# Patient Record
Sex: Female | Born: 1965 | Race: Black or African American | Hispanic: No | Marital: Married | Smoking: Never smoker
Health system: Southern US, Community
[De-identification: ages and names within clinical notes are randomized; demographics above are authoritative.]

## PROBLEM LIST (undated history)

## (undated) DIAGNOSIS — E079 Disorder of thyroid, unspecified: Secondary | ICD-10-CM

## (undated) DIAGNOSIS — D509 Iron deficiency anemia, unspecified: Secondary | ICD-10-CM

## (undated) DIAGNOSIS — I10 Essential (primary) hypertension: Secondary | ICD-10-CM

## (undated) DIAGNOSIS — I219 Acute myocardial infarction, unspecified: Secondary | ICD-10-CM

## (undated) HISTORY — DX: Essential (primary) hypertension: I10

## (undated) HISTORY — DX: Acute myocardial infarction, unspecified: I21.9

## (undated) HISTORY — DX: Iron deficiency anemia, unspecified: D50.9

---

## 2008-01-08 ENCOUNTER — Ambulatory Visit: Payer: Self-pay | Admitting: Internal Medicine

## 2008-04-28 ENCOUNTER — Ambulatory Visit: Payer: Self-pay | Admitting: Gastroenterology

## 2008-05-01 DIAGNOSIS — D509 Iron deficiency anemia, unspecified: Secondary | ICD-10-CM

## 2008-05-01 HISTORY — DX: Iron deficiency anemia, unspecified: D50.9

## 2009-01-13 ENCOUNTER — Ambulatory Visit: Payer: Self-pay | Admitting: Internal Medicine

## 2010-01-10 ENCOUNTER — Ambulatory Visit: Payer: Self-pay | Admitting: Internal Medicine

## 2010-01-20 ENCOUNTER — Ambulatory Visit: Payer: Self-pay | Admitting: Internal Medicine

## 2011-01-24 ENCOUNTER — Ambulatory Visit: Payer: Self-pay

## 2012-01-30 ENCOUNTER — Ambulatory Visit: Payer: Self-pay | Admitting: Internal Medicine

## 2013-02-05 ENCOUNTER — Ambulatory Visit: Payer: Self-pay

## 2013-04-17 ENCOUNTER — Ambulatory Visit: Payer: Self-pay | Admitting: Obstetrics and Gynecology

## 2013-04-21 ENCOUNTER — Ambulatory Visit: Payer: Self-pay | Admitting: Obstetrics and Gynecology

## 2013-04-22 LAB — PATHOLOGY REPORT

## 2014-02-20 ENCOUNTER — Ambulatory Visit: Payer: Self-pay

## 2014-03-05 ENCOUNTER — Ambulatory Visit: Payer: Self-pay | Admitting: Hematology and Oncology

## 2014-03-06 ENCOUNTER — Ambulatory Visit: Payer: Self-pay

## 2014-03-31 ENCOUNTER — Ambulatory Visit: Payer: Self-pay | Admitting: Hematology and Oncology

## 2014-05-01 ENCOUNTER — Ambulatory Visit: Payer: Self-pay | Admitting: Hematology and Oncology

## 2014-06-01 ENCOUNTER — Ambulatory Visit: Payer: Self-pay | Admitting: Hematology and Oncology

## 2014-06-01 ENCOUNTER — Ambulatory Visit: Payer: Self-pay | Admitting: Internal Medicine

## 2014-08-21 NOTE — Op Note (Signed)
PATIENT NAME:  Leslie Daniels, Leslie Daniels MR#:  [id number] DATE OF BIRTH:  [birth year]  DATE OF PROCEDURE:  04/21/2013  PREOPERATIVE DIAGNOSES:  1. Endometrial sac.  2. Menorrhagia.   POSTOPERATIVE DIAGNOSES:  1. Endometrial sac.  2. Menorrhagia.    OPERATION: Includes a suction dilatation and curettage.   ANESTHESIA: General.    SURGEON: Deklynn Charlet S. Valentino Saxonherry, M.D.    ESTIMATED BLOOD LOSS: Approximately 25 mL.    OPERATIVE FLUIDS: 1000 mL.   URINE OUTPUT: 100 mL.    COMPLICATIONS: None.    FINDINGS: Endometrial tissue with suspected endometrial sac.   SPECIMEN: Endometrial sac and endometrial curettings.   CONDITION: Stable.    PROCEDURE: The patient was taken to the operating room where she was placed under general anesthesia without difficulty. She was then placed in the dorsal lithotomy position using [employer] stirrups. Next, a straight catheterization was performed yielding approximately 100 mL of urine. A sterile speculum was then inserted into the vagina, and single-tooth tenaculum was used to grasp the anterior portion of the cervix. The uterus was then sounded to approximately 10 cm and then dilated appropriately to approximately 18 using Pratt dilators. A size 8 curette was then passed into the uterus and the suction device was activated with moderate return of tissue. A sharp curettage was then performed until a gritty texture was noted. The suction curettage was then reintroduced to clear the uterus of any remaining tissue. There was minimal bleeding noted. The single-tooth tenaculum was then removed, and pressure was applied to the cervix for hemostasis. One area of the tenaculum site was still noted to have a small amount of oozing, and a stick of silver nitrate was applied. Good hemostasis was achieved at this time. The patient was then taken to the recovery room in stable condition. She tolerated the procedure well. Instrument and sponge counts were correct x 2 at the end of the  procedure.   ____________________________ Jacques EarthlyAnika S. Valentino Saxonherry, MD asc:gb D: 04/21/2013 21:54:06 ET T: 04/22/2013 05:21:12 ET JOB#: [id number]  cc: Jacques EarthlyAnika S. Valentino Saxonherry, MD, <Dictator> Leslie NovemberANIKA S Niyla Marone MD ELECTRONICALLY SIGNED 04/28/2013 13:27

## 2014-09-13 ENCOUNTER — Ambulatory Visit
Admission: EM | Admit: 2014-09-13 | Discharge: 2014-09-13 | Disposition: A | Discharge status: Home / Self Care | Payer: 59 | Attending: Family Medicine | Admitting: Family Medicine

## 2014-09-13 ENCOUNTER — Encounter: Payer: Self-pay | Admitting: *Deleted

## 2014-09-13 DIAGNOSIS — J3089 Other allergic rhinitis: Secondary | ICD-10-CM | POA: Diagnosis not present

## 2014-09-13 DIAGNOSIS — H6502 Acute serous otitis media, left ear: Secondary | ICD-10-CM | POA: Diagnosis not present

## 2014-09-13 DIAGNOSIS — D649 Anemia, unspecified: Secondary | ICD-10-CM | POA: Insufficient documentation

## 2014-09-13 HISTORY — DX: Disorder of thyroid, unspecified: E07.9

## 2014-09-13 MED ORDER — FEXOFENADINE-PSEUDOEPHED ER 60-120 MG PO TB12: 1.0000 | ORAL_TABLET | Freq: Two times a day (BID) | ORAL | Status: DC

## 2014-09-13 MED ORDER — FLUTICASONE PROPIONATE 50 MCG/ACT NA SUSP: 2.0000 | Freq: Two times a day (BID) | NASAL | Status: DC

## 2014-09-13 NOTE — ED Provider Notes (Signed)
CSN: [id number]     Arrival date & time 09/13/14  0804 History   First MD Initiated Contact with Patient 09/13/14 [phone]     Chief Complaint  Patient presents with  . Otalgia   (Consider location/radiation/quality/duration/timing/severity/associated sxs/prior Treatment) HPI  49 year old female presents complaining of left ear pain. Throughout this week she has had intermittent stabbing pains in her left ear. This comes and goes, it seems to occur more when she is chewing. She denies any pain currently but she did have pain shooting in the ear this morning. It is described as sharp and stabbing. She also has had some congestion and rhinorrhea. No fever, chills, NVD, cough, chest pain, shortness of breath. No significant history of ear infections     Past Medical History  Diagnosis Date  . Anemia   . Anemia 2010  . Thyroid disease    Past Surgical History  Procedure Laterality Date  . Cesarean section with bilateral tubal ligation Bilateral 1989   Family History  Problem Relation Age of Onset  . Diabetes Mother   . Diabetes Other    History  Substance Use Topics  . Smoking status: Never Smoker   . Smokeless tobacco: Never Used  . Alcohol Use: No   OB History    No data available     Review of Systems  Constitutional: Negative for fever and chills.  HENT: Positive for congestion, ear pain and rhinorrhea. Negative for ear discharge.   All other systems reviewed and are negative.   Allergies  Review of patient's allergies indicates no known allergies.  Home Medications   Prior to Admission medications   Medication Sig Start Date End Date Taking? Authorizing Provider  fexofenadine-pseudoephedrine (ALLEGRA-D) 60-120 MG per tablet Take 1 tablet by mouth every 12 (twelve) hours. 09/13/14   Adrian BlackwaterZachary H Eyal Greenhaw, PA-C  fluticasone (FLONASE) 50 MCG/ACT nasal spray Place 2 sprays into both nostrils 2 (two) times daily. Decrease to 2 sprays/nostril daily after 5 days 09/13/14   Graylon GoodZachary H  Michele Kerlin, PA-C   BP 125/74 mmHg  Pulse 73  Temp(Src) 96.5 F (35.8 C) (Tympanic)  Resp 16  Ht 5\' 10"  (1.778 m)  Wt 220 lb (99.791 kg)  BMI 31.57 kg/m2  SpO2 100% Physical Exam  Constitutional: She is oriented to person, place, and time. Vital signs are normal. She appears well-developed and well-nourished. No distress.  HENT:  Head: Normocephalic and atraumatic.  Right Ear: Tympanic membrane, external ear and ear canal normal.  Left Ear: External ear and ear canal normal. Tympanic membrane is not injected and not erythematous. A middle ear effusion (serous otitis media) is present.  Nose: Nose normal. Right sinus exhibits no maxillary sinus tenderness and no frontal sinus tenderness. Left sinus exhibits no maxillary sinus tenderness and no frontal sinus tenderness.  Mouth/Throat: Oropharynx is clear and moist. No oropharyngeal exudate.  Eyes: Conjunctivae are normal.  Neck: Normal range of motion. Neck supple.  Pulmonary/Chest: Effort normal. No respiratory distress.  Neurological: She is alert and oriented to person, place, and time. She has normal strength. Coordination normal.  Skin: Skin is warm and dry. No rash noted. She is not diaphoretic.  Psychiatric: She has a normal mood and affect. Judgment normal.  Nursing note and vitals reviewed.   ED Course  Procedures (including critical care time) Labs Review Labs Reviewed - No data to display  Imaging Review No results found.   MDM   1. Acute serous otitis media of left ear without rupture  2. Other allergic rhinitis    serous otitis media, most likely from allergies. Treat with Flonase as well as Allegra-D. Advised ibuprofen or Aleve for the pain. Follow-up if worsening   Meds ordered this encounter  Medications  . fluticasone (FLONASE) 50 MCG/ACT nasal spray    Sig: Place 2 sprays into both nostrils 2 (two) times daily. Decrease to 2 sprays/nostril daily after 5 days    Dispense:  16 g    Refill:  2    Order  Specific Question:  Supervising Provider    Answer:  Eustace MooreMURRAY, LAURA W [[id number]]  . fexofenadine-pseudoephedrine (ALLEGRA-D) 60-120 MG per tablet    Sig: Take 1 tablet by mouth every 12 (twelve) hours.    Dispense:  30 tablet    Refill:  0    Order Specific Question:  Supervising Provider    Answer:  Eustace MooreMURRAY, LAURA W [[id number]]       Graylon GoodZachary H Myonna Chisom, PA-C 09/13/14 [phone]

## 2014-09-13 NOTE — Discharge Instructions (Signed)
Serous Otitis Media Serous otitis media is fluid in the middle ear space. This space contains the bones for hearing and air. Air in the middle ear space helps to transmit sound.  The air gets there through the eustachian tube. This tube goes from the back of the nose (nasopharynx) to the middle ear space. It keeps the pressure in the middle ear the same as the outside world. It also helps to drain fluid from the middle ear space. CAUSES  Serous otitis media occurs when the eustachian tube gets blocked. Blockage can come from:  Ear infections.  Colds and other upper respiratory infections.  Allergies.  Irritants such as cigarette smoke.  Sudden changes in air pressure (such as descending in an airplane).  Enlarged adenoids.  A mass in the nasopharynx. During colds and upper respiratory infections, the middle ear space can become temporarily filled with fluid. This can happen after an ear infection also. Once the infection clears, the fluid will generally drain out of the ear through the eustachian tube. If it does not, then serous otitis media occurs. SIGNS AND SYMPTOMS   Hearing loss.  A feeling of fullness in the ear, without pain.  Young children may not show any symptoms but may show slight behavioral changes, such as agitation, ear pulling, or crying. DIAGNOSIS  Serous otitis media is diagnosed by an ear exam. Tests may be done to check on the movement of the eardrum. Hearing exams may also be done. TREATMENT  The fluid most often goes away without treatment. If allergy is the cause, allergy treatment may be helpful. Fluid that persists for several months may require minor surgery. A small tube is placed in the eardrum to:  Drain the fluid.  Restore the air in the middle ear space. In certain situations, antibiotic medicines are used to avoid surgery. Surgery may be done to remove enlarged adenoids (if this is the cause). HOME CARE INSTRUCTIONS   Keep children away from  tobacco smoke.  Keep all follow-up visits as directed by your health care provider. SEEK MEDICAL CARE IF:   Your hearing is not better in 3 months.  Your hearing is worse.  You have ear pain.  You have drainage from the ear.  You have dizziness.  You have serous otitis media only in one ear or have any bleeding from your nose (epistaxis).  You notice a lump on your neck. MAKE SURE YOU:  Understand these instructions.   Will watch your condition.   Will get help right away if you are not doing well or get worse.  Document Released: 07/08/2003 Document Revised: 09/01/2013 Document Reviewed: 11/12/2012 [facility] Patient Information 2015 [place], [place]. This information is not intended to replace advice given to you by your health care provider. Make sure you discuss any questions you have with your health care provider.  Allergic Rhinitis Allergic rhinitis is when the mucous membranes in the nose respond to allergens. Allergens are particles in the air that cause your body to have an allergic reaction. This causes you to release allergic antibodies. Through a chain of events, these eventually cause you to release histamine into the blood stream. Although meant to protect the body, it is this release of histamine that causes your discomfort, such as frequent sneezing, congestion, and an itchy, runny nose.  CAUSES  Seasonal allergic rhinitis (hay fever) is caused by pollen allergens that may come from grasses, trees, and weeds. Year-round allergic rhinitis (perennial allergic rhinitis) is caused by allergens such as  house dust mites, pet dander, and mold spores.  SYMPTOMS   Nasal stuffiness (congestion).  Itchy, runny nose with sneezing and tearing of the eyes. DIAGNOSIS  Your health care provider can help you determine the allergen or allergens that trigger your symptoms. If you and your health care provider are unable to determine the allergen, skin or blood testing may be  used. TREATMENT  Allergic rhinitis does not have a cure, but it can be controlled by:  Medicines and allergy shots (immunotherapy).  Avoiding the allergen. Hay fever may often be treated with antihistamines in pill or nasal spray forms. Antihistamines block the effects of histamine. There are over-the-counter medicines that may help with nasal congestion and swelling around the eyes. Check with your health care provider before taking or giving this medicine.  If avoiding the allergen or the medicine prescribed do not work, there are many new medicines your health care provider can prescribe. Stronger medicine may be used if initial measures are ineffective. Desensitizing injections can be used if medicine and avoidance does not work. Desensitization is when a patient is given ongoing shots until the body becomes less sensitive to the allergen. Make sure you follow up with your health care provider if problems continue. HOME CARE INSTRUCTIONS It is not possible to completely avoid allergens, but you can reduce your symptoms by taking steps to limit your exposure to them. It helps to know exactly what you are allergic to so that you can avoid your specific triggers. SEEK MEDICAL CARE IF:   You have a fever.  You develop a cough that does not stop easily (persistent).  You have shortness of breath.  You start wheezing.  Symptoms interfere with normal daily activities. Document Released: 01/10/2001 Document Revised: 04/22/2013 Document Reviewed: 12/23/2012 [facility] Patient Information 2015 [place], [place]. This information is not intended to replace advice given to you by your health care provider. Make sure you discuss any questions you have with your health care provider.

## 2014-09-13 NOTE — ED Notes (Signed)
Complaints of left ear pain with shooting pain up ear.  Side of left face swollen and tender to touch.  Throat slightly red, tonsils enlarged.  Lungs clear.

## 2015-02-23 ENCOUNTER — Other Ambulatory Visit: Payer: Self-pay | Admitting: Nurse Practitioner

## 2015-02-23 DIAGNOSIS — Z1231 Encounter for screening mammogram for malignant neoplasm of breast: Secondary | ICD-10-CM

## 2015-03-18 ENCOUNTER — Ambulatory Visit: Payer: 59 | Admitting: Internal Medicine

## 2015-03-24 ENCOUNTER — Inpatient Hospital Stay: Payer: 59 | Admitting: Internal Medicine

## 2015-03-24 ENCOUNTER — Ambulatory Visit: Payer: 59 | Attending: Nurse Practitioner

## 2015-04-16 ENCOUNTER — Inpatient Hospital Stay: Payer: 59 | Admitting: Internal Medicine

## 2015-04-28 ENCOUNTER — Other Ambulatory Visit: Payer: Self-pay | Admitting: Nurse Practitioner

## 2015-04-28 DIAGNOSIS — G4482 Headache associated with sexual activity: Secondary | ICD-10-CM

## 2015-04-30 ENCOUNTER — Ambulatory Visit
Admission: RE | Admit: 2015-04-30 | Discharge: 2015-04-30 | Disposition: A | Discharge status: Home / Self Care | Payer: 59 | Source: Ambulatory Visit | Attending: Nurse Practitioner | Admitting: Nurse Practitioner

## 2015-04-30 DIAGNOSIS — G4482 Headache associated with sexual activity: Secondary | ICD-10-CM | POA: Diagnosis present

## 2015-04-30 DIAGNOSIS — D649 Anemia, unspecified: Secondary | ICD-10-CM | POA: Insufficient documentation

## 2015-04-30 DIAGNOSIS — E079 Disorder of thyroid, unspecified: Secondary | ICD-10-CM | POA: Insufficient documentation

## 2015-04-30 DIAGNOSIS — H0520 Unspecified exophthalmos: Secondary | ICD-10-CM | POA: Diagnosis not present

## 2015-04-30 MED ORDER — GADOBENATE DIMEGLUMINE 529 MG/ML IV SOLN
20.0000 mL | Freq: Once | INTRAVENOUS | Status: AC | PRN
  Administered 2015-04-30: 20 mL via INTRAVENOUS

## 2015-05-17 ENCOUNTER — Inpatient Hospital Stay: Payer: 59 | Attending: Internal Medicine | Admitting: Internal Medicine

## 2015-05-17 ENCOUNTER — Encounter: Payer: Self-pay | Admitting: Internal Medicine

## 2015-05-17 VITALS — BP 141/75 | HR 88 | Temp 97.6°F | Ht 70.0 in | Wt 234.6 lb

## 2015-05-17 DIAGNOSIS — Z79899 Other long term (current) drug therapy: Secondary | ICD-10-CM | POA: Diagnosis not present

## 2015-05-17 DIAGNOSIS — E079 Disorder of thyroid, unspecified: Secondary | ICD-10-CM | POA: Insufficient documentation

## 2015-05-17 DIAGNOSIS — D500 Iron deficiency anemia secondary to blood loss (chronic): Secondary | ICD-10-CM | POA: Insufficient documentation

## 2015-05-17 DIAGNOSIS — N920 Excessive and frequent menstruation with regular cycle: Secondary | ICD-10-CM | POA: Insufficient documentation

## 2015-05-17 NOTE — Progress Notes (Signed)
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 Malabsorption Vs Hx heavy menstrual periods + ? Thallassemia  # EGD/Colo [2010]-Neg as per pt    INTERVAL HISTORY:  This is my first interaction with the patient since I joined the practice September 2016. I reviewed the patient's prior charts/pertinent labs/imaging in detail; findings are summarized above.   A very pleasant 50 year old African-American female patient with above history of  Anemia " all her life ";  Today's here for follow-up.  Patient states her energy levels are adequate. She is not losing any blood in stools or black stools. She has poor tolerance to by mouth iron because of constipation.  Her last menstrual period was in October 2016.   REVIEW OF SYSTEMS:  A complete 10 point review of system is done which is negative except mentioned above/history of present illness.   PAST MEDICAL HISTORY :  Past Medical History  Diagnosis Date  . IDA (iron deficiency anemia) 2010  . Thyroid disease     PAST SURGICAL HISTORY :   Past Surgical History  Procedure Laterality Date  . Cesarean section with bilateral tubal ligation Bilateral 1989    FAMILY HISTORY :   Family History  Problem Relation Age of Onset  . Diabetes Mother   . Diabetes Other     SOCIAL HISTORY:   Social History  Substance Use Topics  . Smoking status: Never Smoker   . Smokeless tobacco: Never Used  . Alcohol Use: No    ALLERGIES:  has No Known Allergies.  MEDICATIONS:  Current Outpatient Prescriptions  Medication Sig Dispense Refill  . ALL DAY ALLERGY-D 5-120 MG tablet     . fexofenadine-pseudoephedrine (ALLEGRA-D) 60-120 MG per tablet Take 1 tablet by mouth every 12 (twelve) hours. 30 tablet 0  . fluticasone (FLONASE) 50 MCG/ACT  nasal spray Place 2 sprays into both nostrils 2 (two) times daily. Decrease to 2 sprays/nostril daily after 5 days 16 g 2   No current facility-administered medications for this visit.    PHYSICAL EXAMINATION:  BP 141/75 mmHg  Pulse 88  Temp(Src) 97.6 F (36.4 C) (Tympanic)  Ht 5\' 10"  (1.778 m)  Wt 234 lb 9.1 oz (106.4 kg)  BMI 33.66 kg/m2  LMP  (LMP Unknown)  Filed Weights   05/17/15 1539  Weight: 234 lb 9.1 oz (106.4 kg)    GENERAL: Well-nourished well-developed; Alert, no distress and comfortable.   Alone. EYES: no pallor or icterus OROPHARYNX: no thrush or ulceration; good dentition  NECK: supple, no masses felt LYMPH:  no palpable lymphadenopathy in the cervical, axillary or inguinal regions LUNGS: clear to auscultation and  No wheeze or crackles HEART/CVS: regular rate & rhythm and no murmurs; No lower extremity edema ABDOMEN:abdomen soft, non-tender and normal bowel sounds Musculoskeletal:no cyanosis of digits and no clubbing  PSYCH: alert & oriented x 3 with fluent speech NEURO: no focal motor/sensory deficits SKIN:  no rashes or significant lesions  LABORATORY DATA:  I have reviewed the data as listed No results found for: NA, K, CL, CO2, GLUCOSE, BUN, CREATININE, CALCIUM, PROT, ALBUMIN, AST, ALT, ALKPHOS, BILITOT, GFRNONAA, GFRAA  No results found for: SPEP, UPEP  Lab Results  Component Value Date   HGB 9.3* 04/17/2013      Chemistry   No results found for: NA,  K, CL, CO2, BUN, CREATININE, GLU No results found for: CALCIUM, ALKPHOS, AST, ALT, BILITOT     RADIOGRAPHIC STUDIES: I have personally reviewed the radiological images as listed and agreed with the findings in the report. No results found.   ASSESSMENT & PLAN:   #  Severe microcytosis with mild anemia-  Hemoglobin today is 10.6 [ normal limits being 11.6- 15;  Through [facility]];  MCV is 66;  Ferritin is low at 11 [ normal 15];  RDW is elevated.  Patient today's iron deficient-  The etiology is  unclear  Malabsorption versus   History of menorrhagia.  Since patient is clearly iron deficient/ poor tolerance of by mouth iron-  I recommend IV  Feraheme next week.  #  I will recommend follow-up in 6 months with CBC ferritin/  Also hemoglobin electrophoresis/ and possible IV iron at that time.   The above plan of care was discussed the patient and she agrees.   # 15 minutes face-to-face with the patient discussing the above plan of care; more than 50% of time spent on prognosis/ natural history; counseling and coordination.     Earna Coder, MD 05/17/2015 4:21 PM

## 2015-05-19 ENCOUNTER — Ambulatory Visit: Payer: 59 | Admitting: Internal Medicine

## 2015-05-24 ENCOUNTER — Inpatient Hospital Stay: Payer: 59

## 2015-05-24 VITALS — BP 134/77 | HR 86 | Temp 96.3°F | Resp 20

## 2015-05-24 DIAGNOSIS — D500 Iron deficiency anemia secondary to blood loss (chronic): Secondary | ICD-10-CM | POA: Diagnosis not present

## 2015-05-24 MED ORDER — SODIUM CHLORIDE 0.9 % IV SOLN
510.0000 mg | Freq: Once | INTRAVENOUS | Status: AC
  Administered 2015-05-24: 510 mg via INTRAVENOUS
  Filled 2015-05-24: qty 17

## 2015-05-24 MED ORDER — SODIUM CHLORIDE 0.9 % IV SOLN
Freq: Once | INTRAVENOUS | Status: AC
  Administered 2015-05-24: 14:00:00 via INTRAVENOUS
  Filled 2015-05-24: qty 1000

## 2015-08-16 ENCOUNTER — Inpatient Hospital Stay: Payer: 59 | Admitting: Internal Medicine

## 2015-09-02 ENCOUNTER — Telehealth: Payer: Self-pay | Admitting: Internal Medicine

## 2015-09-02 NOTE — Telephone Encounter (Signed)
Pt apt request noted.

## 2015-09-02 NOTE — Telephone Encounter (Signed)
Patient called and said she wanted to push her appt back to July from May 8. Change was made but due to the long delay from initial appt I wanted to document that this was by patient request.

## 2015-09-06 ENCOUNTER — Inpatient Hospital Stay: Payer: 59 | Admitting: Internal Medicine

## 2015-10-11 ENCOUNTER — Encounter: Payer: Self-pay | Admitting: Emergency Medicine

## 2015-10-11 ENCOUNTER — Ambulatory Visit
Admission: EM | Admit: 2015-10-11 | Discharge: 2015-10-11 | Disposition: A | Discharge status: Home / Self Care | Payer: 59 | Attending: Family Medicine | Admitting: Family Medicine

## 2015-10-11 DIAGNOSIS — B852 Pediculosis, unspecified: Secondary | ICD-10-CM

## 2015-10-11 DIAGNOSIS — R21 Rash and other nonspecific skin eruption: Secondary | ICD-10-CM

## 2015-10-11 MED ORDER — PERMETHRIN 5 % EX CREA: TOPICAL_CREAM | CUTANEOUS | Status: DC

## 2015-10-11 MED ORDER — RANITIDINE HCL 150 MG PO CAPS: 150.0000 mg | ORAL_CAPSULE | Freq: Two times a day (BID) | ORAL | Status: DC

## 2015-10-11 MED ORDER — LORATADINE 10 MG PO TABS: 10.0000 mg | ORAL_TABLET | Freq: Every day | ORAL | Status: DC

## 2015-10-11 NOTE — Discharge Instructions (Signed)
Lice, Adult Lice are tiny insects with claws on the ends of their legs. They are small parasites that live on the human body. Lice often make their home in a person's hair, such as hair on the head or in the pubic area. Pubic lice are sometimes referred to as crabs. Lice hatch from little round eggs, which are attached to the base of hairs. Lice eggs are also called nits. Lice cause skin irritation and itching in the area of the infested hair. Although having lice can be annoying, it is not dangerous, and lice do not spread diseases. Treatment will usually clear up the symptoms within a few days. CAUSES Lice can spread from one person to another. Lice crawl. They do not fly or jump. To get lice, you must:  Have very close contact with an infested person.  Share infested items that touch your skin and hair. These include personal items, such as hats, combs, brushes, towels, clothing, pillowcases, or sheets. Pubic lice spread through sexual contact. RISK FACTORS Although having lice is more common among young children, anyone can get lice. Lice tend to thrive in warm weather, so that type of weather increases the risk. SIGNS AND SYMPTOMS  Itchiness in the affected area.  Skin irritation.  Feeling of something moving in the hair.  Rash or sores on the skin.  Tiny flakes or sacs near the scalp. These may be white, yellow, or tan.  Tiny bugs crawling on the hair or scalp. DIAGNOSIS Diagnosis is based on your symptoms and a physical exam. Your health care provider will examine the affected area closely for live lice, tiny eggs (nits), and empty egg cases. Eggs are typically yellow or tan in color. Empty egg cases are whitish. Lice are gray or brown. TREATMENT Treatment for lice includes:  Using a hair rinse that contains a mild insecticide to kill lice. Your health care provider will recommend a prescription or over-the-counter rinse.  Removing lice, eggs, and egg cases by using a comb or  tweezers.  Washing and bagging your clothing and bedding. Pregnant women should not use medicated shampoo or cream without first talking to their health care provider. HOME CARE INSTRUCTIONS  Apply medicated rinse as directed by your health care provider. Follow the label instructions carefully. General instructions for applying rinses may include these steps:  Put on an old shirt or underwear, or use an old towel in case of staining from the rinse.  Wash and towel-dry your head or pubic area before applying the rinse if directed to do so.  When your hair is dry, apply the rinse. Leave the rinse in your hair for the amount of time specified in the instructions.  Rinse the area with water.  Comb your wet hair close to the skin and down to the ends, removing any lice, eggs, or egg cases.  Do not wash the infested hair for 2 days while the medicine kills the lice.  Repeat the treatment if necessary in 7-10 days.  Check for remaining lice, eggs, or egg cases every 2-3 days for 2 weeks or as directed. After treatment, the remaining lice should be moving more slowly.  Remove any remaining lice, eggs, or egg cases using a fine-tooth comb.  Wash all recently used towels, hats, scarves, jackets, bedding, and clothing in hot water.  Place unwashable items that may have been exposed in closed plastic bags for 2 weeks.  Soak all combs and brushes in hot water for 10 minutes.  Vacuum furniture  to remove any loose hair. There is no need to use chemicals, which can be toxic. Lice survive only 1-2 days away from human skin. Eggs may survive only 1 week.  For pubic lice, tell any sexual partners to seek treatment.  For head lice, ask your health care provider if other family members or close contacts should be examined or treated as well.  Keep all follow-up visits as directed by your health care provider. This is important. SEEK MEDICAL CARE IF:  You develop sores that look infected.  Your  rash or sores do not go away in 1 week.  The lice or eggs return or do not go away in spite of treatment. MAKE SURE YOU:  Understand these instructions.  Will watch your condition.  Will get help right away if you are not doing well or get worse.   This information is not intended to replace advice given to you by your health care provider. Make sure you discuss any questions you have with your health care provider.   Document Released: 04/17/2005 Document Revised: 05/08/2014 Document Reviewed: 09/02/2013 Elsevier Interactive Patient Education 2016 [employer].  Allergies An allergy is when your body reacts to a substance in a way that is not normal. An allergic reaction can happen after you:  Eat something.  Breathe in something.  Touch something. WHAT KINDS OF ALLERGIES ARE THERE? You can be allergic to:  Things that are only around during certain seasons, like molds and pollens.  Foods.  Drugs.  Insects.  Animal dander. WHAT ARE SYMPTOMS OF ALLERGIES?  Puffiness (swelling). This may happen on the lips, face, tongue, mouth, or throat.  Sneezing.  Coughing.  Breathing loudly (wheezing).  Stuffy nose.  Tingling in the mouth.  A rash.  Itching.  Itchy, red, puffy areas of skin (hives).  Watery eyes.  Throwing up (vomiting).  Watery poop (diarrhea).  Dizziness.  Feeling faint or fainting.  Trouble breathing or swallowing.  A tight feeling in the chest.  A fast heartbeat. HOW ARE ALLERGIES DIAGNOSED? Allergies can be diagnosed with:  A medical and family history.  Skin tests.  Blood tests.  A food diary. A food diary is a record of all the foods, drinks, and symptoms you have each day.  The results of an elimination diet. This diet involves making sure not to eat certain foods and then seeing what happens when you start eating them again. HOW ARE ALLERGIES TREATED? There is no cure for allergies, but allergic reactions can be treated  with medicine. Severe reactions usually need to be treated at a hospital.  HOW CAN REACTIONS BE PREVENTED? The best way to prevent an allergic reaction is to avoid the thing you are allergic to. Allergy shots and medicines can also help prevent reactions in some cases.   This information is not intended to replace advice given to you by your health care provider. Make sure you discuss any questions you have with your health care provider.   Document Released: 08/12/2012 Document Revised: 05/08/2014 Document Reviewed: 01/27/2014 Elsevier Interactive Patient Education [employer]

## 2015-10-11 NOTE — ED Notes (Signed)
Patient c/o itchy bumps on her arms and legs that started last week.

## 2015-10-11 NOTE — ED Provider Notes (Signed)
CSN: [id number]     Arrival date & time 10/11/15  1800 History   First MD Initiated Contact with Patient 10/11/15 1915    Nurses notes were reviewed. Chief Complaint  Patient presents with  . Rash   Patient with a rash on her arms and her legs. The rash is been going on for about 2-3 weeks. She is no stone or arms started on the left arm now spread to the right arm is also had some lesions or areas on her neck and chest and she just knows some lesions on her legs as well. The rash is very pruritic and she's never had anything like this before. She does state that sometimes she thinks a bug bite because she knows appears be a puncture wound before she starts scratching. She has dose after she scratched lesions for a while the itching does get better. [place] the [place] appeared [place] she thought was nasal tell. She does have a dog outside but does states she does not play with the dogs much   She states her husband I had any trouble with a rash or itching. Past medical history she has a history deficiency anemia and thyroid disease. She's had a C-section tubal ligation. Mother has had diabetes she's never smoked. No known drug allergies.  (Consider location/radiation/quality/duration/timing/severity/associated sxs/prior Treatment) Patient is a 50 y.o. female presenting with rash. The history is provided by the patient. No language interpreter was used.  Rash Location:  Shoulder/arm, leg and head/neck Head/neck rash location:  L neck and R neck Shoulder/arm rash location:  L arm, R arm, L upper arm, R upper arm, L forearm and R forearm Leg rash location:  L leg and R leg Quality: itchiness and redness   Severity:  Moderate Timing:  Constant Progression:  Worsening Context: not chemical exposure, not diapers, not eggs, not exposure to similar rash, not new detergent/soap and not nuts   Relieved by:  Nothing Ineffective treatments:  None  tried Associated symptoms: no abdominal pain, no diarrhea, no fatigue, no fever, no induration, no joint pain, no sore throat, no throat swelling and not vomiting     Past Medical History  Diagnosis Date  . IDA (iron deficiency anemia) 2010  . Thyroid disease    Past Surgical History  Procedure Laterality Date  . Cesarean section with bilateral tubal ligation Bilateral 1989   Family History  Problem Relation Age of Onset  . Diabetes Mother   . Diabetes Other    Social History  Substance Use Topics  . Smoking status: Never Smoker   . Smokeless tobacco: Never Used  . Alcohol Use: No   OB History    No data available     Review of Systems  Constitutional: Negative for fever and fatigue.  HENT: Negative for sore throat.   Gastrointestinal: Negative for vomiting, abdominal pain and diarrhea.  Musculoskeletal: Negative for arthralgias.  Skin: Positive for rash.  All other systems reviewed and are negative.   Allergies  Review of patient's allergies indicates no known allergies.  Home Medications   Prior to Admission medications   Medication Sig Start Date End Date Taking? Authorizing Provider  phentermine 37.5 MG capsule Take 37.5 mg by mouth every morning.   Yes Historical Provider, MD  ALL DAY ALLERGY-D 5-120 MG tablet  02/12/15   Historical Provider, MD  fexofenadine-pseudoephedrine (ALLEGRA-D) 60-120 MG per tablet Take 1 tablet by mouth every 12 (twelve)  hours. 09/13/14   Graylon Good, PA-C  fluticasone (FLONASE) 50 MCG/ACT nasal spray Place 2 sprays into both nostrils 2 (two) times daily. Decrease to 2 sprays/nostril daily after 5 days 09/13/14   Graylon Good, PA-C  loratadine (CLARITIN) 10 MG tablet Take 1 tablet (10 mg total) by mouth daily. Take 1 tablet in the morning. As needed for itching. 10/11/15   Hassan Rowan, MD  permethrin (ELIMITE) 5 % cream Used one time as directed following hygienic suggestions in regards to bed linen and clothes. Leave on 8-10 hours  May repeat in one week and suggest treating spouse as well. 10/11/15   Hassan Rowan, MD  ranitidine (ZANTAC) 150 MG capsule Take 1 capsule (150 mg total) by mouth 2 (two) times daily. 10/11/15   Hassan Rowan, MD   Meds Ordered and Administered this Visit  Medications - No data to display  BP 147/93 mmHg  Pulse 95  Temp(Src) 97.7 F (36.5 C) (Tympanic)  Resp 16  Ht  (1.803 m)  Wt 219 lb (99.338 kg)  BMI 30.56 kg/m2  SpO2 100%  LMP 09/18/2015 (Exact Date) No data found.   Physical Exam  Constitutional: She is oriented to person, place, and time. She appears well-developed and well-nourished.  HENT:  Head: Normocephalic.  Eyes: Conjunctivae are normal. Pupils are equal, round, and reactive to light.  Neck: Neck supple.  Musculoskeletal: She exhibits no edema or tenderness.  Neurological: She is alert and oriented to person, place, and time.  Skin: Rash noted. There is erythema.     Patient with a rash on her arms when she rashes on her neck most of them on the arms and she has others on her legs lower extremities. On one since she's not been scratching it looks like there is been some type of skin breakage consistent with pediculosis infection  Psychiatric: She has a normal mood and affect.  Vitals reviewed.   ED Course  Procedures (including critical care time)  Labs Review Labs Reviewed - No data to display  Imaging Review No results found.   Visual Acuity Review  Right Eye Distance:   Left Eye Distance:   Bilateral Distance:    Right Eye Near:   Left Eye Near:    Bilateral Near:         MDM   1. Pediculosis   2. Rash    We'll place patient on Elimite lotion discussed with her how she and husband should take this in March the bed linen evening of 8-10 hours washing all her clothes and then repeating the washing of clothes and bed linen when she gets out of bed in the morning before she washed off the Elimite lotion. Will give a work note for tomorrow  just in case she needs it repeat this in one week if needed and we'll give her 1 refill on Elimite lotion. 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 Follow-up PCP in a week if not better.  Note: This dictation was prepared with Dragon dictation along with smaller phrase technology. Any transcriptional errors that result from this process are unintentional.    Hassan Rowan, MD 10/11/15 1952

## 2015-11-05 ENCOUNTER — Inpatient Hospital Stay: Payer: 59 | Admitting: Internal Medicine

## 2015-11-13 ENCOUNTER — Ambulatory Visit
Admission: EM | Admit: 2015-11-13 | Discharge: 2015-11-13 | Disposition: A | Discharge status: Home / Self Care | Payer: 59 | Attending: Internal Medicine | Admitting: Internal Medicine

## 2015-11-13 DIAGNOSIS — T783XXA Angioneurotic edema, initial encounter: Secondary | ICD-10-CM

## 2015-11-13 MED ORDER — DIPHENHYDRAMINE HCL 50 MG/ML IJ SOLN
50.0000 mg | Freq: Once | INTRAMUSCULAR | Status: AC
  Administered 2015-11-13: 50 mg via INTRAMUSCULAR

## 2015-11-13 MED ORDER — EPINEPHRINE HCL 1 MG/ML IJ SOLN
0.3000 mg | Freq: Once | INTRAMUSCULAR | Status: AC
  Administered 2015-11-13: 0.3 mg via SUBCUTANEOUS

## 2015-11-13 MED ORDER — METHYLPREDNISOLONE SODIUM SUCC 125 MG IJ SOLR
125.0000 mg | Freq: Once | INTRAMUSCULAR | Status: AC
  Administered 2015-11-13: 125 mg via INTRAMUSCULAR

## 2015-11-13 MED ORDER — EPINEPHRINE 0.3 MG/0.3ML IJ SOAJ: 0.3000 mg | Freq: Once | INTRAMUSCULAR | Status: AC

## 2015-11-13 MED ORDER — PREDNISONE 50 MG PO TABS: 50.0000 mg | ORAL_TABLET | Freq: Every day | ORAL | Status: DC

## 2015-11-13 NOTE — ED Provider Notes (Signed)
CSN: [id number]     Arrival date & time 11/13/15  1516 History   First MD Initiated Contact with Patient 11/13/15 1535     Chief Complaint  Patient presents with  . Allergic Reaction    Tongue swollen    HPI  Patient is a 50 year old lady who presents today with the onset of asymmetric tongue swelling while riding her car.  She had just chewed a piece of green apple bubblegum, and also had eaten a snack food "bugles" earlier today.  Hasn't eaten any other unusual or new foods. No new exposures, hygiene products, soaps. No family history of similar symptoms. No personal history of similar symptoms. Some difficulty swallowing, feels like she needs to keep clearing her throat. No coughing/wheezing, no nausea or vomiting. Reported abdominal discomfort.  Past Medical History  Diagnosis Date  . IDA (iron deficiency anemia) 2010  . Thyroid disease    Past Surgical History  Procedure Laterality Date  . Cesarean section with bilateral tubal ligation Bilateral 1989   Family History  Problem Relation Age of Onset  . Diabetes Mother   . Diabetes Other    Social History  Substance Use Topics  . Smoking status: Never Smoker   . Smokeless tobacco: Never Used  . Alcohol Use: No    Review of Systems  All other systems reviewed and are negative.   Allergies  Review of patient's allergies indicates no known allergies.  Home Medications   Takes no meds regularly   Meds Ordered and Administered this Visit   Medications  EPINEPHrine (ADRENALIN) injection 0.3 mg (0.3 mg Subcutaneous Given 11/13/15 1539)  diphenhydrAMINE (BENADRYL) injection 50 mg (50 mg Intramuscular Given 11/13/15 1539)  methylPREDNISolone sodium succinate (SOLU-MEDROL) 125 mg/2 mL injection 125 mg (125 mg Intramuscular Given 11/13/15 1543)    BP 150/84 mmHg  Pulse 79  Temp(Src) 97.6 F (36.4 C) (Oral)  Resp 18  Ht 5\' 11"  (1.803 m)  Wt 213 lb (96.616 kg)  BMI 29.72 kg/m2  SpO2 100%  LMP 10/14/2015 Physical Exam   Constitutional: She is oriented to person, place, and time. No distress.  Alert, nicely groomed Some difficulties with speech articulation observed. Not drooling  HENT:  Head: Atraumatic.  Mouth/Throat:    Right side of tongue is enlarged compared to the left, but not protruding from the mouth.  Eyes:  Conjugate gaze, no eye redness/drainage  Neck: Neck supple.  Cardiovascular: Normal rate and regular rhythm.   Pulmonary/Chest: No respiratory distress. She has no wheezes. She has no rales.  Lungs clear, symmetric breath sounds No stridor  Abdominal: She exhibits no distension.  Musculoskeletal: Normal range of motion.  No leg swelling  Neurological: She is alert and oriented to person, place, and time.  Skin: Skin is warm and dry.  No cyanosis  Nursing note and vitals reviewed.   ED Course  Procedures (including critical care time)  Swelling and swallowing discomfort improved after administration of benadryl and epi.   MDM   1. Angioedema, initial encounter    Prescriptions for prednisone and an epi-pen were sent to the [facility] in [place]. If you have increasing swelling of tongue/lips or difficulty swallowing again this evening go to ER for recheck.  The epi-pen is to help treat any future episodes of lip/tongue swelling and you should always go the ER for further evaluation if you have to use it. Take benadryl 25-50 mg 4 times daily as needed to keep swelling under control; could try 10mg  claritin or zyrtec  daily instead, starting tomorrow. Generics are fine.    Eustace Moore, MD 11/13/15 [id number]

## 2015-11-13 NOTE — Discharge Instructions (Signed)
Prescriptions for prednisone and an epi-pen were sent to the [facility] in [place]. If you have increasing swelling of tongue/lips or difficulty swallowing again this evening go to ER for recheck.  The epi-pen is to help treat any future episodes of lip/tongue swelling and you should always go the ER for further evaluation if you have to use it. Take benadryl 25-50 mg 4 times daily as needed to keep swelling under control; could try 10mg  claritin or zyrtec daily instead, starting tomorrow. Generics are fine.  Angioedema Angioedema is sudden puffiness (swelling), often of the skin. It can happen:  On your face or privates (genitals).  In your belly (abdomen) or other body parts. It usually happens quickly and gets better in 1 or 2 days. It often starts at night and is found when you wake up. You may get red, itchy patches of skin (hives). Attacks can be dangerous if your breathing passages get puffy. The condition may happen only once, or it can come back at random times. It may happen for several years before it goes away for good. HOME CARE  Only take medicines as told by your doctor.  Always carry your emergency allergy medicines with you.  Wear a medical bracelet as told by your doctor.  Avoid things that you know will cause attacks (triggers). GET HELP IF:  You have another attack.  Your attacks happen more often or get worse.  The condition was passed to you by your parents and you want to have children. GET HELP RIGHT AWAY IF:   Your mouth, tongue, or lips are very puffy.  You have trouble breathing.  You have trouble swallowing.  You pass out (faint). MAKE SURE YOU:   Understand these instructions.  Will watch your condition.  Will get help right away if you are not doing well or get worse.   This information is not intended to replace advice given to you by your health care provider. Make sure you discuss any questions you have with your health care provider.     Document Released: 04/05/2009 Document Revised: 02/05/2013 Document Reviewed: 12/09/2012 Elsevier Interactive Patient Education [employer]2016 Elsevier Inc.

## 2015-11-13 NOTE — ED Notes (Addendum)
Patients presents with a swollen tongue. She states she hasn't had anything to eat today that she hasn't had before. Tongue swelling started at 1:30pm. No difficulty breathing but difficult to swallow. She says her face feels tingly also. Right side of tongue seems to be more swollen than left.

## 2015-11-18 ENCOUNTER — Inpatient Hospital Stay: Payer: 59 | Admitting: Internal Medicine

## 2016-03-21 ENCOUNTER — Other Ambulatory Visit: Payer: Self-pay | Admitting: Nurse Practitioner

## 2016-03-21 DIAGNOSIS — Z1231 Encounter for screening mammogram for malignant neoplasm of breast: Secondary | ICD-10-CM

## 2016-05-02 ENCOUNTER — Ambulatory Visit: Payer: 59

## 2016-05-09 ENCOUNTER — Other Ambulatory Visit: Payer: Self-pay | Admitting: *Deleted

## 2016-05-09 ENCOUNTER — Other Ambulatory Visit: Payer: Self-pay | Admitting: Nurse Practitioner

## 2016-05-09 DIAGNOSIS — D500 Iron deficiency anemia secondary to blood loss (chronic): Secondary | ICD-10-CM

## 2016-05-09 NOTE — Progress Notes (Deleted)
Hematology/Oncology Consult note [facility]  [phone] [phone]  Patient Care Team: No Pcp Per Patient as PCP - General (General Practice)   Name of the patient: Leslie Daniels  [id number]  [birth year]   Date of visit: 05/09/16  Diagnosis- microcytic anemia likely secondary to iron deficiency  Chief complaint/ Reason for visit- routine follow-up of iron deficiency anemia  Heme/Onc history: Patient is a 51 year old female who was last seen by Dr. Donneta Romberg in January 2017. She does have significant microcytosis for moderate degree of anemia. She has received Venofer in the past as well as ferriheme as she was quite constipated from oral iron. Her iron deficiency has been attributed to her heavy menstrual periods. She has not had any hemoglobin electrophoresis for evaluation of thalassemia. She has had an EGD and colonoscopy in 2010 which is negative according to patient but I cannot find those reports in our system.  Interval history- ***  ECOG PS- *** Pain scale- *** Opioid associated constipation- ***  Review of systems- Review of Systems  Constitutional: Negative for chills, fever, malaise/fatigue and weight loss.  HENT: Negative for congestion, ear discharge and nosebleeds.   Eyes: Negative for blurred vision.  Respiratory: Negative for cough, hemoptysis, sputum production, shortness of breath and wheezing.   Cardiovascular: Negative for chest pain, palpitations, orthopnea and claudication.  Gastrointestinal: Negative for abdominal pain, blood in stool, constipation, diarrhea, heartburn, melena, nausea and vomiting.  Genitourinary: Negative for dysuria, flank pain, frequency, hematuria and urgency.  Musculoskeletal: Negative for back pain, joint pain and myalgias.  Skin: Negative for rash.  Neurological: Negative for dizziness, tingling, focal weakness, seizures, weakness and headaches.  Endo/Heme/Allergies: Does not  bruise/bleed easily.  Psychiatric/Behavioral: Negative for depression and suicidal ideas. The patient does not have insomnia.      Current treatment- intermittent IV iron  No Known Allergies   Past Medical History:  Diagnosis Date  . IDA (iron deficiency anemia) 2010  . Thyroid disease      Past Surgical History:  Procedure Laterality Date  . CESAREAN SECTION WITH BILATERAL TUBAL LIGATION Bilateral 1989    Social History   Social History  . Marital status: Married    Spouse name: N/A  . Number of children: N/A  . Years of education: N/A   Occupational History  . Not on file.   Social History Main Topics  . Smoking status: Never Smoker  . Smokeless tobacco: Never Used  . Alcohol use No  . Drug use: No  . Sexual activity: Yes    Partners: Male   Other Topics Concern  . Not on file   Social History Narrative  . No narrative on file    Family History  Problem Relation Age of Onset  . Diabetes Mother   . Diabetes Other      Current Outpatient Prescriptions:  .  EPINEPHrine 0.3 mg/0.3 mL IJ SOAJ injection, Inject 0.3 mLs (0.3 mg total) into the skin once., Disp: 1 Device, Rfl: 1 .  predniSONE (DELTASONE) 50 MG tablet, Take 1 tablet (50 mg total) by mouth daily., Disp: 5 tablet, Rfl: 0  Physical exam: There were no vitals filed for this visit. Physical Exam  Constitutional: She is oriented to person, place, and time and well-developed, well-nourished, and in no distress.  HENT:  Head: Normocephalic and atraumatic.  Eyes: EOM are normal. Pupils are equal, round, and reactive to light.  Neck: Normal range of motion.  Cardiovascular: Normal rate, regular rhythm and  normal heart sounds.   Pulmonary/Chest: Effort normal and breath sounds normal.  Abdominal: Soft. Bowel sounds are normal.  Neurological: She is alert and oriented to person, place, and time.  Skin: Skin is warm and dry.     No flowsheet data found. CBC Latest Ref Rng & Units 04/17/2013    Hemoglobin 12.0 - 16.0 g/dL 1.6(X9.3(L)    No images are attached to the encounter.  No results found.   Assessment and plan- Patient is a 51 y.o. female with microcytic anemia secondary to iron deficiency  1.    Visit Diagnosis No diagnosis found.   Dr. Owens SharkArchana Rao, MD, MPH [facility] at [facility] Pager- [phone] 05/09/2016 1:27 PM

## 2016-05-11 ENCOUNTER — Ambulatory Visit: Payer: 59 | Admitting: Oncology

## 2016-05-11 ENCOUNTER — Other Ambulatory Visit: Payer: 59

## 2016-05-12 ENCOUNTER — Ambulatory Visit: Payer: 59

## 2016-06-09 ENCOUNTER — Ambulatory Visit: Payer: 59

## 2016-06-30 ENCOUNTER — Ambulatory Visit: Admission: RE | Admit: 2016-06-30 | Payer: 59 | Source: Ambulatory Visit

## 2017-02-05 ENCOUNTER — Ambulatory Visit
Admission: RE | Admit: 2017-02-05 | Discharge: 2017-02-05 | Disposition: A | Discharge status: Home / Self Care | Payer: 59 | Source: Ambulatory Visit | Attending: Internal Medicine | Admitting: Internal Medicine

## 2017-02-05 ENCOUNTER — Other Ambulatory Visit: Payer: Self-pay | Admitting: Internal Medicine

## 2017-02-05 DIAGNOSIS — M542 Cervicalgia: Secondary | ICD-10-CM | POA: Insufficient documentation

## 2017-02-05 DIAGNOSIS — M47812 Spondylosis without myelopathy or radiculopathy, cervical region: Secondary | ICD-10-CM | POA: Insufficient documentation

## 2017-02-14 ENCOUNTER — Encounter: Payer: Self-pay | Admitting: *Deleted

## 2017-02-14 ENCOUNTER — Ambulatory Visit
Admission: EM | Admit: 2017-02-14 | Discharge: 2017-02-14 | Disposition: A | Discharge status: Home / Self Care | Payer: 59 | Attending: Family Medicine | Admitting: Family Medicine

## 2017-02-14 DIAGNOSIS — L0211 Cutaneous abscess of neck: Secondary | ICD-10-CM

## 2017-02-14 DIAGNOSIS — Z23 Encounter for immunization: Secondary | ICD-10-CM

## 2017-02-14 MED ORDER — TETANUS-DIPHTH-ACELL PERTUSSIS 5-2.5-18.5 LF-MCG/0.5 IM SUSP
0.5000 mL | Freq: Once | INTRAMUSCULAR | Status: AC
  Administered 2017-02-14: 0.5 mL via INTRAMUSCULAR

## 2017-02-14 MED ORDER — IBUPROFEN 800 MG PO TABS
800.0000 mg | ORAL_TABLET | Freq: Once | ORAL | Status: AC
  Administered 2017-02-14: 800 mg via ORAL

## 2017-02-14 MED ORDER — SULFAMETHOXAZOLE-TRIMETHOPRIM 800-160 MG PO TABS: 1.0000 | ORAL_TABLET | Freq: Two times a day (BID) | ORAL | 0 refills | Status: AC

## 2017-02-14 NOTE — Discharge Instructions (Signed)
Keep wound dry, clean and covered. Recheck x 48-72 hrs.  Take Antibiotic as prescribed. Tylenol/Motrin as needed for pain

## 2017-02-14 NOTE — ED Triage Notes (Signed)
Abcess to base of right posterior neck x2 months. Worse over past few days.

## 2017-02-14 NOTE — ED Provider Notes (Addendum)
[facility]    CSN: [id number] Arrival date & time: 02/14/17  0815     History   Chief Complaint Chief Complaint  Patient presents with  . Abscess    HPI Leslie Daniels is a 51 y.o. female.   HPI 51 y/o female presented to clinic with CC of painful , swollen lump to right side of neck posterior aspect x 2 months, gradually worsening. Pain worse with bending and lateral movement. Area surrounding lump erythematous and tender to palpation. Skin warm to touch. Fluctuance noted to palpation.  Past Medical History:  Diagnosis Date  . IDA (iron deficiency anemia) 2010  . Thyroid disease     Patient Active Problem List   Diagnosis Date Noted  . Iron deficiency anemia due to chronic blood loss 05/17/2015  . Anemia     Past Surgical History:  Procedure Laterality Date  . CESAREAN SECTION WITH BILATERAL TUBAL LIGATION Bilateral 1989    OB History    No data available       Home Medications    Prior to Admission medications   Medication Sig Start Date End Date Taking? Authorizing Provider  EPINEPHrine 0.3 mg/0.3 mL IJ SOAJ injection Inject 0.3 mLs (0.3 mg total) into the skin once. 11/13/15   Eustace Moore, MD  predniSONE (DELTASONE) 50 MG tablet Take 1 tablet (50 mg total) by mouth daily. 11/13/15   Eustace Moore, MD  sulfamethoxazole-trimethoprim (BACTRIM DS,SEPTRA DS) 800-160 MG tablet Take 1 tablet by mouth 2 (two) times daily. 02/14/17 02/24/17  Karle Desrosier, NP    Family History Family History  Problem Relation Age of Onset  . Diabetes Other   . Diabetes Mother     Social History Social History  Substance Use Topics  . Smoking status: Never Smoker  . Smokeless tobacco: Never Used  . Alcohol use No     Allergies   Patient has no known allergies.   Review of Systems Review of Systems  Constitutional: Negative.   HENT: Negative.   Eyes: Negative.   Respiratory: Negative.   Cardiovascular: Negative.   Skin: Positive for  wound (painful swollen lesion/lump to posterior aspect of enck on right).  Neurological: Negative.   Hematological: Negative.      Physical Exam Triage Vital Signs ED Triage Vitals  Enc Vitals Group     BP 02/14/17 0827 (!) 144/77     Pulse Rate 02/14/17 0827 75     Resp 02/14/17 0827 16     Temp 02/14/17 0827 98 F (36.7 C)     Temp Source 02/14/17 0827 Oral     SpO2 02/14/17 0827 100 %     Weight 02/14/17 0827 218 lb (98.9 kg)     Height 02/14/17 0827 5\' 11"  (1.803 m)     Head Circumference --      Peak Flow --      Pain Score 02/14/17 0828 10     Pain Loc --      Pain Edu? --      Excl. in GC? --    No data found.   Updated Vital Signs BP (!) 144/77 (BP Location: Left Arm)   Pulse 75   Temp 98 F (36.7 C) (Oral)   Resp 16   Ht 5\' 11"  (1.803 m)   Wt 218 lb (98.9 kg)   LMP 01/18/2017   SpO2 100%   BMI 30.40 kg/m   Visual Acuity Right Eye Distance:   Left Eye Distance:  Bilateral Distance:    Right Eye Near:   Left Eye Near:    Bilateral Near:     Physical Exam  Constitutional: She is oriented to person, place, and time. She appears well-developed and well-nourished.  HENT:  Head: Normocephalic.  Eyes: Pupils are equal, round, and reactive to light.  Neck: Normal range of motion.  Cardiovascular: Normal rate and regular rhythm.   Pulmonary/Chest: Effort normal and breath sounds normal.  Neurological: She is alert and oriented to person, place, and time.  Skin: There is erythema (Painful, erythematous, swollen, abscess like lesion to right side of neck, posterior aspect. Tender to palpation with surrounding erythema. ).     UC Treatments / Results  Labs (all labs ordered are listed, but only abnormal results are displayed) Labs Reviewed  AEROBIC/ANAEROBIC CULTURE (SURGICAL/DEEP WOUND)    EKG  EKG Interpretation None       Radiology No results found.  Procedures .Marland Kitchen.Incision and Drainage Date/Time: 02/14/2017 9:10 AM Performed by:  Reinaldo RaddleMULTANI, Keni Elison Authorized by: Payton MccallumONTY, [place]   Consent:    Consent obtained:  Verbal   Consent given by:  Patient   Risks discussed:  Bleeding, incomplete drainage, pain, damage to other organs and infection Location:    Type:  Abscess   Size:  4x4 cm   Location:  Neck   Neck location:  R posterior Pre-procedure details:    Skin preparation:  Betadine Anesthesia (see MAR for exact dosages):    Anesthesia method:  Local infiltration   Local anesthetic:  Lidocaine 1% w/o epi (1% lidocaine 4 cc) Procedure type:    Complexity:  Complex (11 blade used for incision. About 6-7 ml's Sebaceous , malodorous drainage retrieved. Multiple loculations present which were cleansed with Swab and irrigated with 20 M'l of Betadine+peroxide.Marland Kitchen.) Procedure details:    Incision types:  Single straight   Scalpel blade:  11   Wound management:  Probed and deloculated and irrigated with saline   Drainage:  Purulent (sebaceous )   Drainage amount:  Copious   Packing materials:  1/4 in gauze Post-procedure details:    Patient tolerance of procedure:  Tolerated well, no immediate complications    (including critical care time)  Medications Ordered in UC Medications  Tdap (BOOSTRIX) injection 0.5 mL (0.5 mLs Intramuscular Given 02/14/17 0903)  ibuprofen (ADVIL,MOTRIN) tablet 800 mg (800 mg Oral Given 02/14/17 0902)     Initial Impression / Assessment and Plan / UC Course  I have reviewed the triage vital signs and the nursing notes.  Pertinent labs & imaging results that were available during my care of the patient were reviewed by me and considered in my medical decision making (see chart for details).    11 blade used for incision. About 6-7 ml's Sebaceous , malodorous drainage retrieved. Multiple loculations present which were cleansed with Swab and irrigated with 20 M'l of Betadine+peroxide Tdap given. Wound packed and sterile , non stick dressing applied.    Final Clinical Impressions(s) /  UC Diagnoses   Final diagnoses:  Abscess of neck    New Prescriptions New Prescriptions   SULFAMETHOXAZOLE-TRIMETHOPRIM (BACTRIM DS,SEPTRA DS) 800-160 MG TABLET    Take 1 tablet by mouth 2 (two) times daily.     Controlled Substance Prescriptions [place] Controlled Substance Registry consulted   Reinaldo RaddleMultani, Tysha Grismore, NP 02/14/17 0920    Reinaldo RaddleMultani, Verania Salberg, NP 02/14/17 [phone]

## 2017-02-16 ENCOUNTER — Encounter: Payer: Self-pay | Admitting: Emergency Medicine

## 2017-02-16 ENCOUNTER — Ambulatory Visit
Admission: EM | Admit: 2017-02-16 | Discharge: 2017-02-16 | Disposition: A | Discharge status: Home / Self Care | Payer: 59 | Attending: Family Medicine | Admitting: Family Medicine

## 2017-02-16 DIAGNOSIS — L089 Local infection of the skin and subcutaneous tissue, unspecified: Secondary | ICD-10-CM | POA: Diagnosis not present

## 2017-02-16 DIAGNOSIS — L723 Sebaceous cyst: Principal | ICD-10-CM

## 2017-02-16 DIAGNOSIS — L0211 Cutaneous abscess of neck: Secondary | ICD-10-CM

## 2017-02-16 MED ORDER — IBUPROFEN 800 MG PO TABS
800.0000 mg | ORAL_TABLET | Freq: Once | ORAL | Status: AC
  Administered 2017-02-16: 800 mg via ORAL

## 2017-02-16 NOTE — ED Triage Notes (Signed)
Patient here for wound check on her neck.  Patient had her abscess lanced on Wed.  Patient reports some drainage.  Patient denies fevers.

## 2017-02-16 NOTE — ED Provider Notes (Signed)
[facility]    CSN: [id number] Arrival date & time: 02/16/17  [id number]   History   Chief Complaint Chief Complaint  Patient presents with  . Wound Check   HPI  [age] year old female presents for wound check.  Patient was recent seen on 10/17. Patient had a posterior neck infected sebaceous cyst. Incision and drainage was performed. She was placed on Bactrim.  Patient resents today for recheck. She states that she's feeling better. No reports of fevers or chills. Wound is draining. She has changed the dressing but has not removed the packing. She reports no other symptoms or concerns at this time.  Past Medical History:  Diagnosis Date  . IDA (iron deficiency anemia) 2010  . Thyroid disease    Patient Active Problem List   Diagnosis Date Noted  . Iron deficiency anemia due to chronic blood loss 05/17/2015  . Anemia    Past Surgical History:  Procedure Laterality Date  . CESAREAN SECTION WITH BILATERAL TUBAL LIGATION Bilateral 1989   OB History    No data available     Home Medications    Prior to Admission medications   Medication Sig Start Date End Date Taking? Authorizing Provider  EPINEPHrine 0.3 mg/0.3 mL IJ SOAJ injection Inject 0.3 mLs (0.3 mg total) into the skin once. 11/13/15   Eustace MooreMurray, Laura W, MD  predniSONE (DELTASONE) 50 MG tablet Take 1 tablet (50 mg total) by mouth daily. 11/13/15   Eustace MooreMurray, Laura W, MD  sulfamethoxazole-trimethoprim (BACTRIM DS,SEPTRA DS) 800-160 MG tablet Take 1 tablet by mouth 2 (two) times daily. 02/14/17 02/24/17  Multani, Bhupinder, NP   Family History Family History  Problem Relation Age of Onset  . Diabetes Other   . Diabetes Mother    Social History Social History  Substance Use Topics  . Smoking status: Never Smoker  . Smokeless tobacco: Never Used  . Alcohol use No   Allergies   Patient has no known allergies.   Review of Systems Review of Systems  Constitutional: Negative.   Skin: Positive for wound.    Physical Exam Triage Vital Signs ED Triage Vitals  Enc Vitals Group     BP 02/16/17 0825 134/72     Pulse Rate 02/16/17 0825 77     Resp 02/16/17 0825 16     Temp 02/16/17 0825 98.2 F (36.8 C)     Temp Source 02/16/17 0825 Oral     SpO2 02/16/17 0825 100 %     Weight 02/16/17 0823 218 lb (98.9 kg)     Height 02/16/17 0823 5\' 11"  (1.803 m)     Head Circumference --      Peak Flow --      Pain Score 02/16/17 0823 0     Pain Loc --      Pain Edu? --      Excl. in GC? --    Updated Vital Signs BP 134/72 (BP Location: Left Arm)   Pulse 77   Temp 98.2 F (36.8 C) (Oral)   Resp 16   Ht 5\' 11"  (1.803 m)   Wt 218 lb (98.9 kg)   LMP 01/18/2017   SpO2 100%   BMI 30.40 kg/m   Physical Exam  Constitutional: She is oriented to person, place, and time. She appears well-developed. No distress.  Pulmonary/Chest: Effort normal. No respiratory distress.  Neurological: She is alert and oriented to person, place, and time.  Skin:  Posterior neck - packing removed and cyst contents and sebaceous material  noted. There is still a lot of surrounding induration. Associated erythema.  Psychiatric: She has a normal mood and affect.  Vitals reviewed.  UC Treatments / Results  Labs (all labs ordered are listed, but only abnormal results are displayed) Labs Reviewed - No data to display  EKG  EKG Interpretation None       Radiology No results found.  Procedures .Marland KitchenIncision and Drainage Date/Time: 02/16/2017 9:21 AM Performed by: Tommie Sams Authorized by: Tommie Sams   Consent:    Consent obtained:  Verbal   Consent given by:  Patient Location:    Indications for incision and drainage: Infected sebaceous cyst.   Location: Posterior neck. Pre-procedure details:    Skin preparation:  Betadine Anesthesia (see MAR for exact dosages):    Anesthesia method:  Local infiltration   Local anesthetic:  Lidocaine 1% WITH epi Procedure type:    Complexity:  Simple Procedure  details:    Incision type: Previous stab incision extended linearly.   Scalpel blade:  11   Techniques: Cyst contents were removed and wound was irrigated with 30 mL's of saline.   Drainage characteristics: Mild peripheral and drainage initially. Remainder was bloody and mixed with cyst contents and cyst sac.   Wound treatment:  Wound left open   Packing materials:  1/4 in gauze Post-procedure details:    Patient tolerance of procedure:  Tolerated well, no immediate complications   (including critical care time)  Medications Ordered in UC Medications - No data to display   Initial Impression / Assessment and Plan / UC Course  I have reviewed the triage vital signs and the nursing notes.  Pertinent labs & imaging results that were available during my care of the patient were reviewed by me and considered in my medical decision making (see chart for details).     51 year old female presents for reevaluation of an incision and drainage of an infected sebaceous cyst. When I evaluated the wound, there was visible cyst contents. I discussed with patient the option of opening the wound and evacuating the cyst contents to prevent recurrence versus continued packing and watchful waiting. Patient elected for the former. Repeat incision was performed and cyst contents were removed and wound was irrigated. She tolerated procedure well. Wound was packed. She is to remove her packing on Sunday and her husband or her sister-in-law who is a nurse will repack the wound. She will see her primary on Monday.  Final Clinical Impressions(s) / UC Diagnoses   Final diagnoses:  Infected sebaceous cyst   New Prescriptions New Prescriptions   No medications on file   Controlled Substance Prescriptions [place] Controlled Substance Registry consulted? Not Applicable   Tommie Sams, [place] 02/16/17 [id number]

## 2017-02-16 NOTE — Discharge Instructions (Signed)
Finish the antibiotic.  Change packing on Sunday.  Please follow up with your PCP on Monday.  Take care  Dr. Adriana Simasook

## 2017-02-21 LAB — AEROBIC/ANAEROBIC CULTURE W GRAM STAIN (SURGICAL/DEEP WOUND): Special Requests: NORMAL

## 2017-02-21 LAB — AEROBIC/ANAEROBIC CULTURE (SURGICAL/DEEP WOUND)

## 2017-05-07 ENCOUNTER — Ambulatory Visit: Payer: Self-pay | Admitting: Nurse Practitioner

## 2017-06-24 ENCOUNTER — Other Ambulatory Visit: Payer: Self-pay

## 2017-06-24 ENCOUNTER — Ambulatory Visit
Admission: EM | Admit: 2017-06-24 | Discharge: 2017-06-24 | Disposition: A | Discharge status: Home / Self Care | Payer: Managed Care, Other (non HMO) | Attending: Family Medicine | Admitting: Family Medicine

## 2017-06-24 DIAGNOSIS — J111 Influenza due to unidentified influenza virus with other respiratory manifestations: Secondary | ICD-10-CM

## 2017-06-24 LAB — RAPID INFLUENZA A&B ANTIGENS
Influenza A (ARMC): NEGATIVE
Influenza B (ARMC): NEGATIVE

## 2017-06-24 MED ORDER — OSELTAMIVIR PHOSPHATE 75 MG PO CAPS: 75.0000 mg | ORAL_CAPSULE | Freq: Two times a day (BID) | ORAL | 0 refills | Status: DC

## 2017-06-24 NOTE — ED Triage Notes (Signed)
Patient c/o HA, body aches, chills and dizziness since Friday night

## 2017-06-24 NOTE — Discharge Instructions (Signed)
Rest, fluids.  Ibuprofen for headache.  Tamiflu as prescribed.  Take care  Dr. Adriana Simasook

## 2017-06-24 NOTE — ED Provider Notes (Signed)
[facility]    CSN: [id number] Arrival date & time: 06/24/17  0801  History   Chief Complaint Chief Complaint  Patient presents with  . Headache   HPI  52 year old female presents with chills, headache, body aches.  Patient reports that her symptoms started on Friday.  She reports significant headache, body aches, chills.  Subjective fever.  She has not taken her temperature.  She has been taking ibuprofen without improvement.  No reported sick contacts..  No known exacerbating relieving factors.  No other associated symptoms.  No other complaints.   Past Medical History:  Diagnosis Date  . IDA (iron deficiency anemia) 2010  . Thyroid disease    Patient Active Problem List   Diagnosis Date Noted  . Iron deficiency anemia due to chronic blood loss 05/17/2015  . Anemia    Past Surgical History:  Procedure Laterality Date  . CESAREAN SECTION WITH BILATERAL TUBAL LIGATION Bilateral 1989   OB History    No data available     Home Medications    Prior to Admission medications   Medication Sig Start Date End Date Taking? Authorizing Provider  EPINEPHrine 0.3 mg/0.3 mL IJ SOAJ injection Inject 0.3 mLs (0.3 mg total) into the skin once. 11/13/15  Yes Isa RankinMurray, Laura Wilson, MD  oseltamivir (TAMIFLU) 75 MG capsule Take 1 capsule (75 mg total) by mouth every 12 (twelve) hours. 06/24/17   Tommie Samsook, Samadhi Mahurin G, DO  fluticasone (FLONASE) 50 MCG/ACT nasal spray Place 2 sprays into both nostrils 2 (two) times daily. Decrease to 2 sprays/nostril daily after 5 days 09/13/14 11/13/15  Graylon GoodBaker, Zachary H, PA-C  loratadine (CLARITIN) 10 MG tablet Take 1 tablet (10 mg total) by mouth daily. Take 1 tablet in the morning. As needed for itching. 10/11/15 11/13/15  Hassan RowanWade, Eugene, MD  phentermine 37.5 MG capsule Take 37.5 mg by mouth every morning.  11/13/15  [provider]  ranitidine (ZANTAC) 150 MG capsule Take 1 capsule (150 mg total) by mouth 2 (two) times daily. 10/11/15 11/13/15  Hassan RowanWade,  Eugene, MD    Family History Family History  Problem Relation Age of Onset  . Diabetes Other   . Diabetes Mother     Social History Social History   Tobacco Use  . Smoking status: Never Smoker  . Smokeless tobacco: Never Used  Substance Use Topics  . Alcohol use: No    Alcohol/week: 0.0 oz  . Drug use: No     Allergies   Patient has no known allergies.   Review of Systems Review of Systems  Constitutional: Positive for chills and fever.  Respiratory: Positive for cough.   Musculoskeletal:       Body aches.  Neurological: Positive for headaches.    Physical Exam Triage Vital Signs ED Triage Vitals  Enc Vitals Group     BP 06/24/17 0815 140/80     Pulse Rate 06/24/17 0815 (!) 104     Resp --      Temp 06/24/17 0815 98.5 F (36.9 C)     Temp Source 06/24/17 0815 Oral     SpO2 06/24/17 0815 97 %     Weight 06/24/17 0812 220 lb (99.8 kg)     Height 06/24/17 0812 5\' 11"  (1.803 m)     Head Circumference --      Peak Flow --      Pain Score 06/24/17 0812 8     Pain Loc --      Pain Edu? --  Excl. in GC? --    Updated Vital Signs BP 140/80 (BP Location: Left Arm)   Pulse (!) 104   Temp 98.5 F (36.9 C) (Oral)   Ht 5\' 11"  (1.803 m)   Wt 220 lb (99.8 kg)   LMP 03/24/2017 Comment: irregular  SpO2 97%   BMI 30.68 kg/m     Physical Exam  Constitutional: She is oriented to person, place, and time. She appears well-developed. No distress.  HENT:  Head: Normocephalic and atraumatic.  Mouth/Throat: Oropharynx is clear and moist.  Cardiovascular:  Tachycardic.  Regular rhythm.  Pulmonary/Chest: Effort normal and breath sounds normal. She has no wheezes. She has no rales.  Neurological: She is alert and oriented to person, place, and time.  Psychiatric: She has a normal mood and affect. Her behavior is normal.  Nursing note and vitals reviewed.  UC Treatments / Results  Labs (all labs ordered are listed, but only abnormal results are displayed) Labs  Reviewed  RAPID INFLUENZA A&B ANTIGENS ([facility] ONLY)    EKG  EKG Interpretation None       Radiology No results found.  Procedures Procedures (including critical care time)  Medications Ordered in UC Medications - No data to display   Initial Impression / Assessment and Plan / UC Course  I have reviewed the triage vital signs and the nursing notes.  Pertinent labs & imaging results that were available during my care of the patient were reviewed by me and considered in my medical decision making (see chart for details).     52 year old female presents with suspected influenza.  Treating with Tamiflu.  Work note given.  Final Clinical Impressions(s) / UC Diagnoses   Final diagnoses:  Influenza    ED Discharge Orders        Ordered    oseltamivir (TAMIFLU) 75 MG capsule  Every 12 hours     06/24/17 0827     Controlled Substance Prescriptions [place] Controlled Substance Registry consulted? Not Applicable   Tommie Sams, [place] 06/24/17 [phone]

## 2017-06-27 ENCOUNTER — Telehealth: Payer: Self-pay | Admitting: Emergency Medicine

## 2017-06-27 NOTE — Telephone Encounter (Signed)
Called to follow up after patient's recent visit. LM to call with any questions or concerns. 

## 2017-09-27 ENCOUNTER — Encounter: Payer: Self-pay | Admitting: Nurse Practitioner

## 2017-09-27 ENCOUNTER — Ambulatory Visit: Payer: Managed Care, Other (non HMO) | Admitting: Nurse Practitioner

## 2017-09-27 VITALS — BP 141/85 | HR 85 | Resp 16 | Ht 71.0 in | Wt 233.2 lb

## 2017-09-27 DIAGNOSIS — Z1211 Encounter for screening for malignant neoplasm of colon: Secondary | ICD-10-CM | POA: Diagnosis not present

## 2017-09-27 DIAGNOSIS — Z1231 Encounter for screening mammogram for malignant neoplasm of breast: Secondary | ICD-10-CM

## 2017-09-27 DIAGNOSIS — E663 Overweight: Secondary | ICD-10-CM

## 2017-09-27 DIAGNOSIS — Z1239 Encounter for other screening for malignant neoplasm of breast: Secondary | ICD-10-CM

## 2017-09-27 DIAGNOSIS — J309 Allergic rhinitis, unspecified: Secondary | ICD-10-CM

## 2017-09-27 MED ORDER — PHENTERMINE HCL 37.5 MG PO TABS: 37.5000 mg | ORAL_TABLET | Freq: Every day | ORAL | 1 refills | Status: DC

## 2017-09-27 NOTE — Progress Notes (Signed)
[facility] [address] [place], [place] [address]  Internal MEDICINE  Office Visit Note  Patient Name: Leslie Daniels  [id number]  [id number]  Date of Service: 10/14/2017   Pt is here for routine follow up.   Chief Complaint  Patient presents with  . Weight Gain    pt would like to discuss wt management     The patient is concerned about 11 pound weight gain since her last visit. Got out of her routine with exercise and eating healthy earlier in the year, after having the flu. She would like to restart phentermine to help suppress appetite. She plans to start with exercise 3 to 4 times per week and eating a healthy diet. She is due to have mammogram and screening colonoscopy.       Current Medication: Outpatient Encounter Medications as of 09/27/2017  Medication Sig  . EPINEPHrine 0.3 mg/0.3 mL IJ SOAJ injection Inject 0.3 mLs (0.3 mg total) into the skin once.  Marland Kitchen oseltamivir (TAMIFLU) 75 MG capsule Take 1 capsule (75 mg total) by mouth every 12 (twelve) hours. (Patient not taking: Reported on 09/27/2017)  . [DISCONTINUED] phentermine (ADIPEX-P) 37.5 MG tablet Take 1 tablet (37.5 mg total) by mouth daily before breakfast.   No facility-administered encounter medications on file as of 09/27/2017.     Surgical History: Past Surgical History:  Procedure Laterality Date  . CESAREAN SECTION WITH BILATERAL TUBAL LIGATION Bilateral 1989    Medical History: Past Medical History:  Diagnosis Date  . IDA (iron deficiency anemia) 2010  . Thyroid disease     Family History: Family History  Problem Relation Age of Onset  . Diabetes Other   . Diabetes Mother     Social History   Socioeconomic History  . Marital status: Married    Spouse name: Not on file  . Number of children: Not on file  . Years of education: Not on file  . Highest education level: Not on file  Occupational History  . Not on file  Social Needs  . Financial resource strain: Not on file   . Food insecurity:    Worry: Not on file    Inability: Not on file  . Transportation needs:    Medical: Not on file    Non-medical: Not on file  Tobacco Use  . Smoking status: Never Smoker  . Smokeless tobacco: Never Used  Substance and Sexual Activity  . Alcohol use: No    Alcohol/week: 0.0 oz  . Drug use: No  . Sexual activity: Yes    Partners: Male  Lifestyle  . Physical activity:    Days per week: Not on file    Minutes per session: Not on file  . Stress: Not on file  Relationships  . Social connections:    Talks on phone: Not on file    Gets together: Not on file    Attends religious service: Not on file    Active member of club or organization: Not on file    Attends meetings of clubs or organizations: Not on file    Relationship status: Not on file  . Intimate partner violence:    Fear of current or ex partner: Not on file    Emotionally abused: Not on file    Physically abused: Not on file    Forced sexual activity: Not on file  Other Topics Concern  . Not on file  Social History Narrative  . Not on file  Review of Systems  Constitutional: Positive for unexpected weight change. Negative for activity change, chills and fatigue.       Weight gain of 11 pounds since most recent visit.   HENT: Negative for congestion, postnasal drip, rhinorrhea, sneezing and sore throat.   Eyes: Negative for redness.  Respiratory: Negative for cough, chest tightness and shortness of breath.   Cardiovascular: Negative for chest pain and palpitations.  Gastrointestinal: Negative for abdominal pain, constipation, diarrhea, nausea and vomiting.  Endocrine: Negative for cold intolerance, heat intolerance, polydipsia, polyphagia and polyuria.  Genitourinary: Negative for dysuria and frequency.  Musculoskeletal: Negative for arthralgias, back pain, joint swelling and neck pain.  Skin: Negative for rash.  Allergic/Immunologic: Positive for environmental allergies.   Neurological: Negative for dizziness, tremors, numbness and headaches.  Hematological: Negative for adenopathy. Does not bruise/bleed easily.  Psychiatric/Behavioral: Negative for behavioral problems (Depression), sleep disturbance and suicidal ideas. The patient is not nervous/anxious.    Today's Vitals   09/27/17 1434  BP: (!) 141/85  Pulse: 85  Resp: 16  SpO2: 99%  Weight: 233 lb 3.2 oz (105.8 kg)  Height:  (1.803 m)    Physical Exam  Constitutional: She is oriented to person, place, and time. She appears well-developed and well-nourished. No distress.  HENT:  Head: Normocephalic and atraumatic.  Mouth/Throat: Oropharynx is clear and moist. No oropharyngeal exudate.  Eyes: Pupils are equal, round, and reactive to light. Conjunctivae and EOM are normal.  Neck: Normal range of motion. Neck supple. No JVD present. No tracheal deviation present. No thyromegaly present.  Cardiovascular: Normal rate, regular rhythm and normal heart sounds. Exam reveals no gallop and no friction rub.  No murmur heard. Pulmonary/Chest: Effort normal and breath sounds normal. No respiratory distress. She has no wheezes. She has no rales. She exhibits no tenderness.  Abdominal: Soft. Bowel sounds are normal. There is no tenderness.  Musculoskeletal: Normal range of motion.  Lymphadenopathy:    She has no cervical adenopathy.  Neurological: She is alert and oriented to person, place, and time. No cranial nerve deficit.  Skin: Skin is warm and dry. She is not diaphoretic.  Psychiatric: She has a normal mood and affect. Her behavior is normal. Judgment and thought content normal.  Nursing note and vitals reviewed.  Assessment/Plan: 1. Allergic rhinitis, unspecified seasonality, unspecified trigger Continue to use OTC medication, such as zyrtec or claritin, every day.   2. Overweight Restart phentermine 37.5mg  tablets daily. Encouraged her to limit calorie intake to 1500 calories per day and  participate in regular physical exercise.    3. Screening for breast cancer - MM DIGITAL SCREENING BILATERAL; Future  4. Screening for colon cancer Refer to GI for screening colonoscopy.  - Ambulatory referral to Gastroenterology  General Counseling: Leslie Daniels verbalizes understanding of the findings of todays visit and agrees with plan of treatment. I have discussed any further diagnostic evaluation that may be needed or ordered today. We also reviewed her medications today. she has been encouraged to call the office with any questions or concerns that should arise related to todays visit.    Counseling:   There is a liability release in patients' chart. There has been a 10 minute discussion about the side effects including but not limited to elevated blood pressure, anxiety, lack of sleep and dry mouth. Pt understands and will like to start/continue on appetite suppressant at this time. There will be one month RX given at the time of visit with proper follow up. Nova diet plan  with restricted calories is given to the pt. Pt understands and agrees with  plan of treatment  This patient was seen by Vincent Gros, FNP- C in Collaboration with Dr Lyndon Code as a part of collaborative care agreement  Orders Placed This Encounter  Procedures  . MM DIGITAL SCREENING BILATERAL  . Ambulatory referral to Gastroenterology    Meds ordered this encounter  Medications  . DISCONTD: phentermine (ADIPEX-P) 37.5 MG tablet    Sig: Take 1 tablet (37.5 mg total) by mouth daily before breakfast.    Dispense:  30 tablet    Refill:  1    Order Specific Question:   Supervising Provider    Answer:   Lyndon Code [[id number]]    Time spent: 72 Minutes       Dr Lyndon Code Internal medicine

## 2017-09-28 ENCOUNTER — Ambulatory Visit: Payer: Self-pay | Admitting: Nurse Practitioner

## 2017-09-28 ENCOUNTER — Other Ambulatory Visit: Payer: Self-pay

## 2017-09-28 DIAGNOSIS — Z1211 Encounter for screening for malignant neoplasm of colon: Secondary | ICD-10-CM

## 2017-10-01 ENCOUNTER — Other Ambulatory Visit: Payer: Self-pay | Admitting: Nurse Practitioner

## 2017-10-01 DIAGNOSIS — E663 Overweight: Secondary | ICD-10-CM

## 2017-10-01 MED ORDER — PHENTERMINE HCL 37.5 MG PO TABS: 37.5000 mg | ORAL_TABLET | Freq: Every day | ORAL | 1 refills | Status: DC

## 2017-10-01 NOTE — Progress Notes (Signed)
Sent new rx for phentermine 37.5mg  tablets to [facility]. Was mistakenly sent to [facility]

## 2017-10-02 ENCOUNTER — Telehealth: Payer: Self-pay | Admitting: Nurse Practitioner

## 2017-10-02 NOTE — Telephone Encounter (Signed)
Prior auth for phentermine has been approved

## 2017-10-14 DIAGNOSIS — Z1211 Encounter for screening for malignant neoplasm of colon: Secondary | ICD-10-CM | POA: Insufficient documentation

## 2017-10-14 DIAGNOSIS — J309 Allergic rhinitis, unspecified: Secondary | ICD-10-CM | POA: Insufficient documentation

## 2017-10-14 DIAGNOSIS — E663 Overweight: Secondary | ICD-10-CM | POA: Insufficient documentation

## 2017-10-23 ENCOUNTER — Encounter: Payer: Self-pay | Admitting: *Deleted

## 2017-10-23 ENCOUNTER — Other Ambulatory Visit: Payer: Self-pay

## 2017-10-26 NOTE — Discharge Instructions (Signed)
General Anesthesia, Adult, Care After °These instructions provide you with information about caring for yourself after your procedure. Your health care provider may also give you more specific instructions. Your treatment has been planned according to current medical practices, but problems sometimes occur. Call your health care provider if you have any problems or questions after your procedure. °What can I expect after the procedure? °After the procedure, it is common to have: °· Vomiting. °· A sore throat. °· Mental slowness. ° °It is common to feel: °· Nauseous. °· Cold or shivery. °· Sleepy. °· Tired. °· Sore or achy, even in parts of your body where you did not have surgery. ° °Follow these instructions at home: °For at least 24 hours after the procedure: °· Do not: °? Participate in activities where you could fall or become injured. °? Drive. °? Use heavy machinery. °? Drink alcohol. °? Take sleeping pills or medicines that cause drowsiness. °? Make important decisions or sign legal documents. °? Take care of children on your own. °· Rest. °Eating and drinking °· If you vomit, drink water, juice, or soup when you can drink without vomiting. °· Drink enough fluid to keep your urine clear or pale yellow. °· Make sure you have little or no nausea before eating solid foods. °· Follow the diet recommended by your health care provider. °General instructions °· Have a responsible adult stay with you until you are awake and alert. °· Return to your normal activities as told by your health care provider. Ask your health care provider what activities are safe for you. °· Take over-the-counter and prescription medicines only as told by your health care provider. °· If you smoke, do not smoke without supervision. °· Keep all follow-up visits as told by your health care provider. This is important. °Contact a health care provider if: °· You continue to have nausea or vomiting at home, and medicines are not helpful. °· You  cannot drink fluids or start eating again. °· You cannot urinate after 8-12 hours. °· You develop a skin rash. °· You have fever. °· You have increasing redness at the site of your procedure. °Get help right away if: °· You have difficulty breathing. °· You have chest pain. °· You have unexpected bleeding. °· You feel that you are having a life-threatening or urgent problem. °This information is not intended to replace advice given to you by your health care provider. Make sure you discuss any questions you have with your health care provider. °Document Released: 07/24/2000 Document Revised: 09/20/2015 Document Reviewed: 04/01/2015 °Elsevier Interactive Patient Education © 2018 [employer] ° °

## 2017-10-29 ENCOUNTER — Ambulatory Visit
Admission: RE | Admit: 2017-10-29 | Discharge: 2017-10-29 | Disposition: A | Discharge status: Home / Self Care | Payer: Managed Care, Other (non HMO) | Source: Ambulatory Visit | Attending: Gastroenterology | Admitting: Gastroenterology

## 2017-10-29 ENCOUNTER — Ambulatory Visit: Payer: Managed Care, Other (non HMO) | Admitting: Anesthesiology

## 2017-10-29 ENCOUNTER — Encounter
Admission: RE | Disposition: A | Discharge status: Home / Self Care | Payer: Self-pay | Source: Ambulatory Visit | Attending: Gastroenterology

## 2017-10-29 ENCOUNTER — Ambulatory Visit: Payer: Self-pay | Admitting: Nurse Practitioner

## 2017-10-29 DIAGNOSIS — D509 Iron deficiency anemia, unspecified: Secondary | ICD-10-CM | POA: Diagnosis not present

## 2017-10-29 DIAGNOSIS — K640 First degree hemorrhoids: Secondary | ICD-10-CM | POA: Insufficient documentation

## 2017-10-29 DIAGNOSIS — Z79899 Other long term (current) drug therapy: Secondary | ICD-10-CM | POA: Diagnosis not present

## 2017-10-29 DIAGNOSIS — Z0001 Encounter for general adult medical examination with abnormal findings: Secondary | ICD-10-CM

## 2017-10-29 DIAGNOSIS — E039 Hypothyroidism, unspecified: Secondary | ICD-10-CM | POA: Diagnosis not present

## 2017-10-29 DIAGNOSIS — Z1211 Encounter for screening for malignant neoplasm of colon: Secondary | ICD-10-CM | POA: Insufficient documentation

## 2017-10-29 HISTORY — PX: COLONOSCOPY WITH PROPOFOL: SHX5780

## 2017-10-29 SURGERY — COLONOSCOPY WITH PROPOFOL
Anesthesia: General | Wound class: Contaminated

## 2017-10-29 MED ORDER — PROPOFOL 10 MG/ML IV BOLUS
INTRAVENOUS | Status: DC | PRN
  Administered 2017-10-29 (×3): 20 mg via INTRAVENOUS
  Administered 2017-10-29: 100 mg via INTRAVENOUS
  Administered 2017-10-29: 20 mg via INTRAVENOUS
  Administered 2017-10-29: 40 mg via INTRAVENOUS
  Administered 2017-10-29: 20 mg via INTRAVENOUS

## 2017-10-29 MED ORDER — LIDOCAINE HCL (CARDIAC) PF 100 MG/5ML IV SOSY
PREFILLED_SYRINGE | INTRAVENOUS | Status: DC | PRN
  Administered 2017-10-29: 40 mg via INTRAVENOUS

## 2017-10-29 MED ORDER — OXYCODONE HCL 5 MG/5ML PO SOLN: 5.0000 mg | Freq: Once | ORAL | Status: DC | PRN

## 2017-10-29 MED ORDER — SODIUM CHLORIDE 0.9 % IV SOLN: INTRAVENOUS | Status: DC

## 2017-10-29 MED ORDER — OXYCODONE HCL 5 MG PO TABS: 5.0000 mg | ORAL_TABLET | Freq: Once | ORAL | Status: DC | PRN

## 2017-10-29 MED ORDER — LACTATED RINGERS IV SOLN
INTRAVENOUS | Status: DC
  Administered 2017-10-29: 11:00:00 via INTRAVENOUS

## 2017-10-29 MED ORDER — STERILE WATER FOR IRRIGATION IR SOLN
Status: DC | PRN
  Administered 2017-10-29: 3 mL

## 2017-10-29 SURGICAL SUPPLY — 24 items

## 2017-10-29 NOTE — Anesthesia Preprocedure Evaluation (Signed)
Anesthesia Evaluation  Patient identified by MRN, date of birth, ID band Patient awake    Airway Mallampati: II  TM Distance: >3 FB Neck ROM: full    Dental no notable dental hx.    Pulmonary neg pulmonary ROS,    Pulmonary exam normal        Cardiovascular negative cardio ROS Normal cardiovascular exam     Neuro/Psych    GI/Hepatic negative GI ROS, Neg liver ROS,   Endo/Other    Renal/GU      Musculoskeletal   Abdominal   Peds  Hematology   Anesthesia Other Findings   Reproductive/Obstetrics negative OB ROS                             Anesthesia Physical Anesthesia Plan  ASA: II  Anesthesia Plan: General   Post-op Pain Management:    Induction:   PONV Risk Score and Plan:   Airway Management Planned:   Additional Equipment:   Intra-op Plan:   Post-operative Plan:   Informed Consent: I have reviewed the patients History and Physical, chart, labs and discussed the procedure including the risks, benefits and alternatives for the proposed anesthesia with the patient or authorized representative who has indicated his/her understanding and acceptance.     Plan Discussed with:   Anesthesia Plan Comments:         Anesthesia Quick Evaluation

## 2017-10-29 NOTE — H&P (Signed)
Leslie Miniumarren [name], MD [facility] [address] [place], [place] [address] Phone: [phone] Fax : [phone]  Primary Care Physician:  Lyndon CodeKhan, Fozia M, MD Primary Gastroenterologist:  Dr. Servando SnareWohl  Pre-Procedure History & Physical: HPI:  Leslie Daniels is a 52 y.o. female is here for a screening colonoscopy.   Past Medical History:  Diagnosis Date  . IDA (iron deficiency anemia) 2010  . Thyroid disease     Past Surgical History:  Procedure Laterality Date  . CESAREAN SECTION WITH BILATERAL TUBAL LIGATION Bilateral 1989    Prior to Admission medications   Medication Sig Start Date End Date Taking? Authorizing Provider  EPINEPHrine 0.3 mg/0.3 mL IJ SOAJ injection Inject 0.3 mLs (0.3 mg total) into the skin once. 11/13/15  Yes Isa RankinMurray, Laura Wilson, MD  phentermine (ADIPEX-P) 37.5 MG tablet Take 1 tablet (37.5 mg total) by mouth daily before breakfast. 10/01/17  Yes Carlean JewsBoscia, Heather E, NP    Allergies as of 09/28/2017  . (No Known Allergies)    Family History  Problem Relation Age of Onset  . Diabetes Other   . Diabetes Mother     Social History   Socioeconomic History  . Marital status: Married    Spouse name: Not on file  . Number of children: Not on file  . Years of education: Not on file  . Highest education level: Not on file  Occupational History  . Not on file  Social Needs  . Financial resource strain: Not on file  . Food insecurity:    Worry: Not on file    Inability: Not on file  . Transportation needs:    Medical: Not on file    Non-medical: Not on file  Tobacco Use  . Smoking status: Never Smoker  . Smokeless tobacco: Never Used  Substance and Sexual Activity  . Alcohol use: No    Alcohol/week: 0.0 oz    Comment: may have occasional drink on Holidays  . Drug use: No  . Sexual activity: Yes    Partners: Male  Lifestyle  . Physical activity:    Days per week: Not on file    Minutes per session: Not on file  . Stress: Not on file    Relationships  . Social connections:    Talks on phone: Not on file    Gets together: Not on file    Attends religious service: Not on file    Active member of club or organization: Not on file    Attends meetings of clubs or organizations: Not on file    Relationship status: Not on file  . Intimate partner violence:    Fear of current or ex partner: Not on file    Emotionally abused: Not on file    Physically abused: Not on file    Forced sexual activity: Not on file  Other Topics Concern  . Not on file  Social History Narrative  . Not on file    Review of Systems: See HPI, otherwise negative ROS  Physical Exam: Ht 5\' 11"  (1.803 m)   Wt 233 lb (105.7 kg)   LMP 07/27/2017 (Exact Date)   BMI 32.50 kg/m  General:   Alert,  pleasant and cooperative in NAD Head:  Normocephalic and atraumatic. Neck:  Supple; no masses or thyromegaly. Lungs:  Clear throughout to auscultation.    Heart:  Regular rate and rhythm. Abdomen:  Soft, nontender and nondistended. Normal bowel sounds, without guarding, and without rebound.   Neurologic:  Alert  and  oriented x4;  grossly normal neurologically.  Impression/Plan: Leslie Daniels is now here to undergo a screening colonoscopy.  Risks, benefits, and alternatives regarding colonoscopy have been reviewed with the patient.  Questions have been answered.  All parties agreeable.

## 2017-10-29 NOTE — Op Note (Signed)
[facility] Patient Name: Leslie Daniels Procedure Date: 10/29/2017 10:27 AM MRN: [id number] Account #: [id number] Date of Birth: April 19, 1966 Admit Type: Outpatient Age: 52 Room: [facility] ROOM 01 Gender: Female Note Status: Finalized Procedure:            Colonoscopy Indications:          Screening for colorectal malignant neoplasm Providers:            Midge Minium MD, MD Referring MD:         Lyndon Code, MD (Referring MD) Medicines:            Propofol per Anesthesia Complications:        No immediate complications. Procedure:            Pre-Anesthesia Assessment:                       - Prior to the procedure, a History and Physical was                        performed, and patient medications and allergies were                        reviewed. The patient's tolerance of previous                        anesthesia was also reviewed. The risks and benefits of                        the procedure and the sedation options and risks were                        discussed with the patient. All questions were                        answered, and informed consent was obtained. Prior                        Anticoagulants: The patient has taken no previous                        anticoagulant or antiplatelet agents. ASA Grade                        Assessment: II - A patient with mild systemic disease.                        After reviewing the risks and benefits, the patient was                        deemed in satisfactory condition to undergo the                        procedure.                       After obtaining informed consent, the colonoscope was                        passed under direct vision. Throughout the procedure,  the patient's blood pressure, pulse, and oxygen                        saturations were monitored continuously. The was                        introduced through the anus and advanced to the the                  cecum, identified by appendiceal orifice and ileocecal                        valve. The colonoscopy was performed without                        difficulty. The patient tolerated the procedure well.                        The quality of the bowel preparation was excellent. Findings:      The perianal and digital rectal examinations were normal.      Non-bleeding internal hemorrhoids were found during retroflexion. The       hemorrhoids were Grade I (internal hemorrhoids that do not prolapse). Impression:           - Non-bleeding internal hemorrhoids.                       - No specimens collected. Recommendation:       - Discharge patient to home.                       - Resume previous diet.                       - Continue present medications.                       - Repeat colonoscopy in 10 years for screening unless                        any change in family history or lower GI problems. Procedure Code(s):    --- Professional ---                       [phone], Colonoscopy, flexible; diagnostic, including                        collection of specimen(s) by brushing or washing, when                        performed (separate procedure) Diagnosis Code(s):    --- Professional ---                       Z12.11, Encounter for screening for malignant neoplasm                        of colon CPT copyright 2017 American Medical Association. All rights reserved. The codes documented in this report are preliminary and upon coder review may  be revised to meet current compliance requirements. Midge Miniumarren Elford Evilsizer MD, MD 10/29/2017 10:48:44 AM This report has been signed electronically. Number of Addenda: 0 Note Initiated On: 10/29/2017 10:27 AM  Scope Withdrawal Time: 0 hours 6 minutes 51 seconds  Total Procedure Duration: 0 hours 10 minutes 22 seconds       [facility]

## 2017-10-29 NOTE — Transfer of Care (Signed)
Immediate Anesthesia Transfer of Care Note  Patient: Leslie Daniels  Procedure(s) Performed: COLONOSCOPY WITH PROPOFOL (N/A )  Patient Location: PACU  Anesthesia Type: General  Level of Consciousness: awake, alert  and patient cooperative  Airway and Oxygen Therapy: Patient Spontanous Breathing and Patient connected to supplemental oxygen  Post-op Assessment: Post-op Vital signs reviewed, Patient's Cardiovascular Status Stable, Respiratory Function Stable, Patent Airway and No signs of Nausea or vomiting  Post-op Vital Signs: Reviewed and stable  Complications: No apparent anesthesia complications

## 2017-10-29 NOTE — Anesthesia Procedure Notes (Signed)
Procedure Name: MAC Date/Time: 10/29/2017 10:31 AM Performed by: Janna Arch, CRNA Pre-anesthesia Checklist: Patient identified, Emergency Drugs available, Suction available and Patient being monitored Patient Re-evaluated:Patient Re-evaluated prior to induction Oxygen Delivery Method: Nasal cannula

## 2017-10-29 NOTE — Anesthesia Postprocedure Evaluation (Signed)
Anesthesia Post Note  Patient: Leslie Daniels  Procedure(s) Performed: COLONOSCOPY WITH PROPOFOL (N/A )  Patient location during evaluation: PACU Anesthesia Type: General Level of consciousness: awake and alert Pain management: pain level controlled Vital Signs Assessment: post-procedure vital signs reviewed and stable Respiratory status: spontaneous breathing Cardiovascular status: blood pressure returned to baseline Postop Assessment: no headache Anesthetic complications: no    Verner Cholunkle, III,  Mckinzie Saksa D

## 2017-10-30 ENCOUNTER — Encounter: Payer: Self-pay | Admitting: Gastroenterology

## 2017-11-02 ENCOUNTER — Ambulatory Visit: Payer: Managed Care, Other (non HMO) | Admitting: Nurse Practitioner

## 2017-11-02 ENCOUNTER — Encounter: Payer: Self-pay | Admitting: Nurse Practitioner

## 2017-11-02 VITALS — BP 132/78 | HR 77 | Resp 16 | Ht 71.0 in | Wt 226.0 lb

## 2017-11-02 DIAGNOSIS — E663 Overweight: Secondary | ICD-10-CM | POA: Diagnosis not present

## 2017-11-02 DIAGNOSIS — J309 Allergic rhinitis, unspecified: Secondary | ICD-10-CM | POA: Diagnosis not present

## 2017-11-02 MED ORDER — PHENTERMINE HCL 37.5 MG PO TABS: 37.5000 mg | ORAL_TABLET | Freq: Every day | ORAL | 1 refills | Status: DC

## 2017-11-02 NOTE — Progress Notes (Signed)
[facility] [address] [place], [place] [address]  Internal MEDICINE  Office Visit Note  Patient Name: Leslie Daniels  [id number]  [id number]  Date of Service: 11/30/2017   Pt is here for routine follow up.   Chief Complaint  Patient presents with  . Weight Loss    7 pound weight loss since most     The patient is currently taking phentermine to help with weight loss. She has loss 7 pounds since her last visit. She reports no negative side effects associated with taking phentermine. She is consuming a 1500 calorie diet and she has joined [employer]. She is also walking four to five times per weeek.       Current Medication: Outpatient Encounter Medications as of 11/02/2017  Medication Sig  . EPINEPHrine 0.3 mg/0.3 mL IJ SOAJ injection Inject 0.3 mLs (0.3 mg total) into the skin once.  . phentermine (ADIPEX-P) 37.5 MG tablet Take 1 tablet (37.5 mg total) by mouth daily before breakfast.  . [DISCONTINUED] phentermine (ADIPEX-P) 37.5 MG tablet Take 1 tablet (37.5 mg total) by mouth daily before breakfast.   No facility-administered encounter medications on file as of 11/02/2017.     Surgical History: Past Surgical History:  Procedure Laterality Date  . CESAREAN SECTION WITH BILATERAL TUBAL LIGATION Bilateral 1989  . COLONOSCOPY WITH PROPOFOL N/A 10/29/2017   Procedure: COLONOSCOPY WITH PROPOFOL;  Surgeon: Midge Minium, MD;  [facility];  Service: Endoscopy;  Laterality: N/A;    Medical History: Past Medical History:  Diagnosis Date  . IDA (iron deficiency anemia) 2010  . Thyroid disease     Family History: Family History  Problem Relation Age of Onset  . Diabetes Other   . Diabetes Mother     Social History   Socioeconomic History  . Marital status: Married    Spouse name: Not on file  . Number of children: Not on file  . Years of education: Not on file  . Highest education level: Not on file  Occupational History  . Not  on file  Social Needs  . Financial resource strain: Not on file  . Food insecurity:    Worry: Not on file    Inability: Not on file  . Transportation needs:    Medical: Not on file    Non-medical: Not on file  Tobacco Use  . Smoking status: Never Smoker  . Smokeless tobacco: Never Used  Substance and Sexual Activity  . Alcohol use: No    Alcohol/week: 0.0 oz    Comment: may have occasional drink on Holidays  . Drug use: No  . Sexual activity: Yes    Partners: Male  Lifestyle  . Physical activity:    Days per week: Not on file    Minutes per session: Not on file  . Stress: Not on file  Relationships  . Social connections:    Talks on phone: Not on file    Gets together: Not on file    Attends religious service: Not on file    Active member of club or organization: Not on file    Attends meetings of clubs or organizations: Not on file    Relationship status: Not on file  . Intimate partner violence:    Fear of current or ex partner: Not on file    Emotionally abused: Not on file    Physically abused: Not on file    Forced sexual activity: Not on file  Other Topics Concern  .  Not on file  Social History Narrative  . Not on file      Review of Systems  Constitutional: Negative for activity change, chills, fatigue and unexpected weight change.       Weight loss of 7 pounds since most recent visit.   HENT: Negative for congestion, postnasal drip, rhinorrhea, sneezing and sore throat.   Eyes: Negative for redness.  Respiratory: Negative for cough, chest tightness and shortness of breath.   Cardiovascular: Negative for chest pain and palpitations.  Gastrointestinal: Negative for abdominal pain, constipation, diarrhea, nausea and vomiting.  Endocrine: Negative for cold intolerance, heat intolerance, polydipsia, polyphagia and polyuria.  Genitourinary: Negative for dysuria and frequency.  Musculoskeletal: Negative for arthralgias, back pain, joint swelling and neck pain.   Skin: Negative for rash.  Allergic/Immunologic: Positive for environmental allergies.  Neurological: Negative for dizziness, tremors, numbness and headaches.  Hematological: Negative for adenopathy. Does not bruise/bleed easily.  Psychiatric/Behavioral: Negative for behavioral problems (Depression), sleep disturbance and suicidal ideas. The patient is not nervous/anxious.     Today's Vitals   11/02/17 1607  BP: 132/78  Pulse: 77  Resp: 16  SpO2: 100%  Weight: 226 lb (102.5 kg)  Height: 5\' 11"  (1.803 m)    Physical Exam  Constitutional: She is oriented to person, place, and time. She appears well-developed and well-nourished. No distress.  HENT:  Head: Normocephalic and atraumatic.  Nose: Nose normal.  Mouth/Throat: Oropharynx is clear and moist. No oropharyngeal exudate.  Eyes: Pupils are equal, round, and reactive to light. Conjunctivae and EOM are normal.  Neck: Normal range of motion. Neck supple. No JVD present. No tracheal deviation present. No thyromegaly present.  Cardiovascular: Normal rate, regular rhythm and normal heart sounds. Exam reveals no gallop and no friction rub.  No murmur heard. Pulmonary/Chest: Effort normal and breath sounds normal. No respiratory distress. She has no wheezes. She has no rales. She exhibits no tenderness.  Abdominal: Soft. Bowel sounds are normal. There is no tenderness.  Musculoskeletal: Normal range of motion.  Lymphadenopathy:    She has no cervical adenopathy.  Neurological: She is alert and oriented to person, place, and time. No cranial nerve deficit.  Skin: Skin is warm and dry. She is not diaphoretic.  Psychiatric: She has a normal mood and affect. Her behavior is normal. Judgment and thought content normal.  Nursing note and vitals reviewed.  Assessment/Plan: 1. Allergic rhinitis, unspecified seasonality, unspecified trigger Continue OTC medication to treat acute symptoms. Avoid any known triggers if possible.   2.  Overweight Improving. May continue phentermine daily. Limit calorie intake to 1500 calories per day. Continue to participate in regular physical activvity.  - phentermine (ADIPEX-P) 37.5 MG tablet; Take 1 tablet (37.5 mg total) by mouth daily before breakfast.  Dispense: 30 tablet; Refill: 1  General Counseling: Adra verbalizes understanding of the findings of todays visit and agrees with plan of treatment. I have discussed any further diagnostic evaluation that may be needed or ordered today. We also reviewed her medications today. she has been encouraged to call the office with any questions or concerns that should arise related to todays visit.    Counseling:  This patient was seen by Vincent GrosHeather Reeve Mallo FNP Collaboration with Dr Lyndon CodeFozia M Khan as a part of collaborative care agreement   Meds ordered this encounter  Medications  . phentermine (ADIPEX-P) 37.5 MG tablet    Sig: Take 1 tablet (37.5 mg total) by mouth daily before breakfast.    Dispense:  30 tablet  Refill:  1    Order Specific Question:   Supervising Provider    Answer:   Lavera Guise [[id number]]    Time spent: 106 Minutes     Dr Lavera Guise Internal medicine

## 2017-11-23 ENCOUNTER — Other Ambulatory Visit: Payer: Self-pay | Admitting: Nurse Practitioner

## 2017-11-30 DIAGNOSIS — R5383 Other fatigue: Secondary | ICD-10-CM | POA: Insufficient documentation

## 2017-12-10 ENCOUNTER — Ambulatory Visit: Payer: Managed Care, Other (non HMO) | Admitting: Nurse Practitioner

## 2017-12-10 ENCOUNTER — Encounter: Payer: Self-pay | Admitting: Nurse Practitioner

## 2017-12-10 VITALS — BP 133/88 | HR 79 | Resp 16 | Ht 72.0 in | Wt 222.2 lb

## 2017-12-10 DIAGNOSIS — R7301 Impaired fasting glucose: Secondary | ICD-10-CM | POA: Diagnosis not present

## 2017-12-10 DIAGNOSIS — Z124 Encounter for screening for malignant neoplasm of cervix: Secondary | ICD-10-CM | POA: Diagnosis not present

## 2017-12-10 DIAGNOSIS — E559 Vitamin D deficiency, unspecified: Secondary | ICD-10-CM | POA: Diagnosis not present

## 2017-12-10 DIAGNOSIS — Z1159 Encounter for screening for other viral diseases: Secondary | ICD-10-CM

## 2017-12-10 DIAGNOSIS — Z0001 Encounter for general adult medical examination with abnormal findings: Secondary | ICD-10-CM

## 2017-12-10 DIAGNOSIS — E663 Overweight: Secondary | ICD-10-CM | POA: Diagnosis not present

## 2017-12-10 DIAGNOSIS — R300 Dysuria: Secondary | ICD-10-CM | POA: Insufficient documentation

## 2017-12-10 MED ORDER — PHENTERMINE HCL 37.5 MG PO TABS: 37.5000 mg | ORAL_TABLET | Freq: Every day | ORAL | 1 refills | Status: DC

## 2017-12-10 NOTE — Progress Notes (Signed)
[facility] [address] [place], [place] [address]  Internal MEDICINE  Office Visit Note  Patient Name: Leslie CoveYolanda P Daniels  [id number]  [id number]  Date of Service: 12/10/2017   Pt is here for routine health maintenance examination  Chief Complaint  Patient presents with  . Annual Exam  . Gynecologic Exam  . Dental Pain    upper left part of the mouth     Weight loss of 3 pounds since last visit. Is taking phentermine, however, blood pressure is elevated today. Does have a toothache which is likely leading to elevated blood pressure. No other concerns or complaints today.     Current Medication: Outpatient Encounter Medications as of 12/10/2017  Medication Sig  . EPINEPHrine 0.3 mg/0.3 mL IJ SOAJ injection Inject 0.3 mLs (0.3 mg total) into the skin once.  . phentermine (ADIPEX-P) 37.5 MG tablet Take 1 tablet (37.5 mg total) by mouth daily before breakfast.  . [DISCONTINUED] phentermine (ADIPEX-P) 37.5 MG tablet Take 1 tablet (37.5 mg total) by mouth daily before breakfast.   No facility-administered encounter medications on file as of 12/10/2017.     Surgical History: Past Surgical History:  Procedure Laterality Date  . CESAREAN SECTION WITH BILATERAL TUBAL LIGATION Bilateral 1989  . COLONOSCOPY WITH PROPOFOL N/A 10/29/2017   Procedure: COLONOSCOPY WITH PROPOFOL;  Surgeon: Midge MiniumWohl, Darren, MD;  [facility];  Service: Endoscopy;  Laterality: N/A;    Medical History: Past Medical History:  Diagnosis Date  . IDA (iron deficiency anemia) 2010  . Thyroid disease     Family History: Family History  Problem Relation Age of Onset  . Diabetes Other   . Diabetes Mother       Review of Systems  Constitutional: Negative for activity change, chills, fatigue and unexpected weight change.       Weight loss of 3 pounds since most recent visit.   HENT: Negative for congestion, postnasal drip, rhinorrhea, sneezing and sore throat.   Eyes:  Negative for redness.  Respiratory: Negative for cough, chest tightness and shortness of breath.   Cardiovascular: Negative for chest pain and palpitations.  Gastrointestinal: Negative for abdominal pain, constipation, diarrhea, nausea and vomiting.  Endocrine: Negative for cold intolerance, heat intolerance, polydipsia, polyphagia and polyuria.       Enlarged thyroid.   Genitourinary: Negative.  Negative for dysuria and frequency.  Musculoskeletal: Negative for arthralgias, back pain, joint swelling and neck pain.  Skin: Negative for rash.  Allergic/Immunologic: Positive for environmental allergies.  Neurological: Negative for dizziness, tremors, numbness and headaches.  Hematological: Negative for adenopathy. Does not bruise/bleed easily.  Psychiatric/Behavioral: Negative for behavioral problems (Depression), sleep disturbance and suicidal ideas. The patient is not nervous/anxious.      Today's Vitals   12/10/17 0948  BP: 133/88  Pulse: 79  Resp: 16  SpO2: 100%  Weight: 222 lb 3.2 oz (100.8 kg)  Height: 6' (1.829 m)   Physical Exam  Constitutional: She is oriented to person, place, and time. She appears well-developed and well-nourished. No distress.  HENT:  Head: Normocephalic and atraumatic.  Nose: Nose normal.  Mouth/Throat: Oropharynx is clear and moist. No oropharyngeal exudate.  Eyes: Pupils are equal, round, and reactive to light. Conjunctivae and EOM are normal.  Neck: Normal range of motion. Neck supple. No JVD present. No tracheal deviation present. Thyromegaly present.  Cardiovascular: Normal rate, regular rhythm, normal heart sounds and intact distal pulses. Exam reveals no gallop and no friction rub.  No murmur heard. Pulmonary/Chest: Effort  normal and breath sounds normal. No respiratory distress. She has no wheezes. She has no rales. She exhibits no tenderness. Right breast exhibits no inverted nipple, no mass, no nipple discharge, no skin change and no tenderness.  Left breast exhibits no inverted nipple, no mass, no nipple discharge, no skin change and no tenderness.  Abdominal: Soft. Bowel sounds are normal. There is no tenderness.  Genitourinary: Vagina normal and uterus normal.  Genitourinary Comments: No tenderness, masses, or organomeglay present during bimanual exam .  Musculoskeletal: Normal range of motion.  Lymphadenopathy:    She has no cervical adenopathy.  Neurological: She is alert and oriented to person, place, and time. No cranial nerve deficit.  Skin: Skin is warm and dry. Capillary refill takes less than 2 seconds. She is not diaphoretic.  Psychiatric: She has a normal mood and affect. Her behavior is normal. Judgment and thought content normal.  Nursing note and vitals reviewed.  Assessment/Plan:  1. Encounter for general adult medical examination with abnormal findings Annual wellness visit today - CBC with Differential/Platelet - Comprehensive metabolic panel - T4, free - TSH - Lipid panel  2. Impaired fasting glucose Ordered per request from employer. - HgB A1c  3. Overweight 10 pounds lost over past 3 months. Continue phentermine daily. Limit calorie intake to 1500 calories per day and continue with routine exercise. - phentermine (ADIPEX-P) 37.5 MG tablet; Take 1 tablet (37.5 mg total) by mouth daily before breakfast.  Dispense: 30 tablet; Refill: 1  4. Vitamin D deficiency - Vitamin D 1,25 dihydroxy  5. Screening for malignant neoplasm of cervix - Pap IG and HPV (high risk) DNA detection  6. Dysuria - UA/M w/rflx Culture, Routine  7. Screening for measles - OL MEASLES/MUMPS/RUBELLA/TITRE   General Counseling: [name] verbalizes understanding of the findings of todays visit and agrees with plan of treatment. I have discussed any further diagnostic evaluation that may be needed or ordered today. We also reviewed her medications today. she has been encouraged to call the office with any questions or concerns that  should arise related to todays visit.    Counseling:   There is a liability release in patients' chart. There has been a 10 minute discussion about the side effects including but not limited to elevated blood pressure, anxiety, lack of sleep and dry mouth. Pt understands and will like to start/continue on appetite suppressant at this time. There will be one month RX given at the time of visit with proper follow up. Nova diet plan with restricted calories is given to the pt. Pt understands and agrees with  plan of treatment  This patient was seen by Vincent GrosHeather Johnnathan Hagemeister FNP Collaboration with Dr Lyndon CodeFozia M Khan as a part of collaborative care agreement  Orders Placed This Encounter  Procedures  . UA/M w/rflx Culture, Routine  . CBC with Differential/Platelet  . Comprehensive metabolic panel  . T4, free  . TSH  . Lipid panel  . Vitamin D 1,25 dihydroxy  . HgB A1c  . OL MEASLES/MUMPS/RUBELLA/TITRE    Meds ordered this encounter  Medications  . phentermine (ADIPEX-P) 37.5 MG tablet    Sig: Take 1 tablet (37.5 mg total) by mouth daily before breakfast.    Dispense:  30 tablet    Refill:  1    Order Specific Question:   Supervising Provider    Answer:   Lyndon CodeKHAN, FOZIA M [[id number]]    Time spent: 7830 Minutes      Lyndon CodeFozia M Khan, MD  Internal Medicine

## 2017-12-11 LAB — UA/M W/RFLX CULTURE, ROUTINE
Bilirubin, UA: NEGATIVE
Glucose, UA: NEGATIVE
KETONES UA: NEGATIVE
Leukocytes, UA: NEGATIVE
Nitrite, UA: NEGATIVE
Protein, UA: NEGATIVE
RBC UA: NEGATIVE
Specific Gravity, UA: 1.027 (ref 1.005–1.030)
UUROB: 0.2 mg/dL (ref 0.2–1.0)
pH, UA: 5 (ref 5.0–7.5)

## 2017-12-11 LAB — MICROSCOPIC EXAMINATION: Casts: NONE SEEN /lpf

## 2017-12-13 LAB — PAP IG AND HPV HIGH-RISK
HPV, high-risk: NEGATIVE
PAP SMEAR COMMENT: 0

## 2017-12-16 NOTE — Progress Notes (Signed)
Please let the patient know her pap smear was normal. thanks

## 2017-12-17 ENCOUNTER — Ambulatory Visit: Payer: Self-pay

## 2017-12-17 ENCOUNTER — Telehealth: Payer: Self-pay

## 2017-12-17 NOTE — Telephone Encounter (Signed)
Pt advised  Pap is normal

## 2017-12-17 NOTE — Telephone Encounter (Signed)
Pt advised pap

## 2018-01-04 ENCOUNTER — Encounter: Payer: Self-pay | Admitting: Emergency Medicine

## 2018-01-04 ENCOUNTER — Other Ambulatory Visit: Payer: Self-pay

## 2018-01-04 ENCOUNTER — Ambulatory Visit
Admission: EM | Admit: 2018-01-04 | Discharge: 2018-01-04 | Disposition: A | Discharge status: Home / Self Care | Payer: Managed Care, Other (non HMO) | Attending: Emergency Medicine | Admitting: Emergency Medicine

## 2018-01-04 DIAGNOSIS — L282 Other prurigo: Secondary | ICD-10-CM

## 2018-01-04 DIAGNOSIS — R21 Rash and other nonspecific skin eruption: Secondary | ICD-10-CM | POA: Diagnosis not present

## 2018-01-04 MED ORDER — MUPIROCIN 2 % EX OINT: 1.0000 "application " | TOPICAL_OINTMENT | Freq: Three times a day (TID) | CUTANEOUS | 0 refills | Status: DC

## 2018-01-04 MED ORDER — TRIAMCINOLONE ACETONIDE 0.1 % EX CREA
1.0000 | TOPICAL_CREAM | Freq: Two times a day (BID) | CUTANEOUS | 0 refills | Status: DC
Start: 2018-01-04 — End: 2019-07-08

## 2018-01-04 MED ORDER — DOXYCYCLINE HYCLATE 100 MG PO CAPS: 100.0000 mg | ORAL_CAPSULE | Freq: Two times a day (BID) | ORAL | 0 refills | Status: DC

## 2018-01-04 MED ORDER — METHYLPREDNISOLONE 4 MG PO TBPK: ORAL_TABLET | ORAL | 0 refills | Status: DC

## 2018-01-04 NOTE — ED Triage Notes (Signed)
Pt c/o rash on her feet and legs. Started about 2 weeks ago. It is itchy. The last couple of days she noticed them on her waist line.

## 2018-01-04 NOTE — Discharge Instructions (Addendum)
Take Zyrtec Allegra or Claritin during daytime to help prevent itching.  Use Benadryl 50 to 75 mg at nighttime for itching.  If not improving follow-up with dermatology.

## 2018-01-04 NOTE — ED Provider Notes (Signed)
[facility]    CSN: [id number] Arrival date & time: 01/04/18  1558     History   Chief Complaint Chief Complaint  Patient presents with  . Rash    HPI Leslie Daniels is a 52 y.o. female.   HPI  52 year old female presents with rash on her lower extremities and waist and very sparse on her  Arms.  Attended a picnic around the middle of August and  shortly afterwards broke out into this rash.  It is on the uncovered areas that she had opposed  during the picnic.  Not extend above her ankles on her legs except for isolated areas.  Rash is punctate papular pustular areas with some excoriated and crusting over top.  States that it is very itchy.  Became concerned when it started appearing around her waist.  She does have dogs at home but she states are always out of doors.  One small area has been unroofed and has become secondarily infected.       Past Medical History:  Diagnosis Date  . IDA (iron deficiency anemia) 2010  . Thyroid disease     Patient Active Problem List   Diagnosis Date Noted  . Impaired fasting glucose 12/10/2017  . Vitamin D deficiency 12/10/2017  . Screening for malignant neoplasm of cervix 12/10/2017  . Dysuria 12/10/2017  . Screening for measles 12/10/2017  . Fatigue 11/30/2017  . Encounter for general adult medical examination with abnormal findings   . Allergic rhinitis 10/14/2017  . Overweight 10/14/2017  . Screening for colon cancer 10/14/2017  . Iron deficiency anemia due to chronic blood loss 05/17/2015  . Anemia     Past Surgical History:  Procedure Laterality Date  . CESAREAN SECTION WITH BILATERAL TUBAL LIGATION Bilateral 1989  . COLONOSCOPY WITH PROPOFOL N/A 10/29/2017   Procedure: COLONOSCOPY WITH PROPOFOL;  Surgeon: Midge Minium, MD;  Location: [facility];  Service: Endoscopy;  Laterality: N/A;    OB History   None      Home Medications    Prior to Admission medications   Medication Sig Start Date End  Date Taking? Authorizing Provider  EPINEPHrine 0.3 mg/0.3 mL IJ SOAJ injection Inject 0.3 mLs (0.3 mg total) into the skin once. 11/13/15  Yes Isa Rankin, MD  doxycycline (VIBRAMYCIN) 100 MG capsule Take 1 capsule (100 mg total) by mouth 2 (two) times daily. 01/04/18   Lutricia Feil, PA-C  methylPREDNISolone (MEDROL DOSEPAK) 4 MG TBPK tablet Take per package instructions 01/04/18   Lutricia Feil, PA-C  mupirocin ointment (BACTROBAN) 2 % Apply 1 application topically 3 (three) times daily. 01/04/18   Lutricia Feil, PA-C  triamcinolone cream (KENALOG) 0.1 % Apply 1 application topically 2 (two) times daily. 01/04/18   Lutricia Feil, PA-C    Family History Family History  Problem Relation Age of Onset  . Diabetes Other   . Diabetes Mother     Social History Social History   Tobacco Use  . Smoking status: Never Smoker  . Smokeless tobacco: Never Used  Substance Use Topics  . Alcohol use: No    Alcohol/week: 0.0 standard drinks    Comment: may have occasional drink on Holidays  . Drug use: No     Allergies   Patient has no known allergies.   Review of Systems Review of Systems  Constitutional: Positive for activity change. Negative for appetite change, chills, fatigue and fever.  Skin: Positive for rash.  All other systems reviewed  and are negative.    Physical Exam Triage Vital Signs ED Triage Vitals  Enc Vitals Group     BP 01/04/18 1652 139/84     Pulse Rate 01/04/18 1652 88     Resp 01/04/18 1652 17     Temp 01/04/18 1652 98.4 F (36.9 C)     Temp Source 01/04/18 1652 Oral     SpO2 01/04/18 1652 100 %     Weight 01/04/18 1650 220 lb (99.8 kg)     Height 01/04/18 1650 5\' 10"  (1.778 m)     Head Circumference --      Peak Flow --      Pain Score 01/04/18 1649 10     Pain Loc --      Pain Edu? --      Excl. in GC? --    No data found.  Updated Vital Signs BP 139/84 (BP Location: Left Arm)   Pulse 88   Temp 98.4 F (36.9 C) (Oral)   Resp  17   Ht 5\' 10"  (1.778 m)   Wt 220 lb (99.8 kg)   LMP  (Within Months)   SpO2 100%   BMI 31.57 kg/m   Visual Acuity Right Eye Distance:   Left Eye Distance:   Bilateral Distance:    Right Eye Near:   Left Eye Near:    Bilateral Near:     Physical Exam  Constitutional: She is oriented to person, place, and time. She appears well-developed and well-nourished. No distress.  HENT:  Head: Normocephalic.  Eyes: Pupils are equal, round, and reactive to light. Right eye exhibits no discharge. Left eye exhibits no discharge.  Neck: Normal range of motion.  Musculoskeletal: Normal range of motion.  Neurological: She is alert and oriented to person, place, and time.  Skin: Skin is warm and dry. Rash noted. She is not diaphoretic.  Refer to description in HPI and also the attached photographs for detail  Psychiatric: She has a normal mood and affect. Her behavior is normal. Thought content normal.  Nursing note and vitals reviewed.          UC Treatments / Results  Labs (all labs ordered are listed, but only abnormal results are displayed) Labs Reviewed - No data to display  EKG None  Radiology No results found.  Procedures Procedures (including critical care time)  Medications Ordered in UC Medications - No data to display  Initial Impression / Assessment and Plan / UC Course  I have reviewed the triage vital signs and the nursing notes.  Pertinent labs & imaging results that were available during my care of the patient were reviewed by me and considered in my medical decision making (see chart for details).     Plan: 1. Test/x-ray results and diagnosis reviewed with patient 2. rx as per orders; risks, benefits, potential side effects reviewed with patient 3. Recommend supportive treatment with ensuring that all the animals been treated for fleas.  Will provide with topical steroids as well as oral for a short course.  For the infected area we will divide her with  mupirocin ointment and a short course of doxycycline.  She does not improve she needs to follow-up with dermatology. 4. F/u prn if symptoms worsen or don't improve  Final Clinical Impressions(s) / UC Diagnoses   Final diagnoses:  Rash  Papular urticaria     Discharge Instructions     Take Zyrtec Allegra or Claritin during daytime to help prevent itching.  Use  Benadryl 50 to 75 mg at nighttime for itching.  If not improving follow-up with dermatology.   ED Prescriptions    Medication Sig Dispense Auth. Provider   mupirocin ointment (BACTROBAN) 2 % Apply 1 application topically 3 (three) times daily. 22 g Ovid Curd P, PA-C   methylPREDNISolone (MEDROL DOSEPAK) 4 MG TBPK tablet Take per package instructions 21 tablet Lutricia Feil, PA-C   doxycycline (VIBRAMYCIN) 100 MG capsule Take 1 capsule (100 mg total) by mouth 2 (two) times daily. 10 capsule Ovid Curd P, PA-C   triamcinolone cream (KENALOG) 0.1 % Apply 1 application topically 2 (two) times daily. 30 g Lutricia Feil, PA-C     Controlled Substance Prescriptions [place] Controlled Substance Registry consulted? Not Applicable   Lutricia Feil, PA-C 01/04/18 4782

## 2018-01-21 ENCOUNTER — Ambulatory Visit: Payer: Self-pay | Admitting: Nurse Practitioner

## 2018-11-13 ENCOUNTER — Ambulatory Visit
Admission: EM | Admit: 2018-11-13 | Discharge: 2018-11-13 | Disposition: A | Discharge status: Home / Self Care | Payer: Managed Care, Other (non HMO) | Attending: Family Medicine | Admitting: Family Medicine

## 2018-11-13 ENCOUNTER — Encounter: Payer: Self-pay | Admitting: Emergency Medicine

## 2018-11-13 ENCOUNTER — Other Ambulatory Visit: Payer: Self-pay

## 2018-11-13 DIAGNOSIS — Z20828 Contact with and (suspected) exposure to other viral communicable diseases: Secondary | ICD-10-CM

## 2018-11-13 DIAGNOSIS — Z20822 Contact with and (suspected) exposure to covid-19: Secondary | ICD-10-CM

## 2018-11-13 NOTE — Discharge Instructions (Signed)
Monitor for any symptoms Self quarantine at least until test result is back

## 2018-11-13 NOTE — ED Provider Notes (Signed)
[facility]    CSN: [id number] Arrival date & time: 11/13/18  1719     History   Chief Complaint Chief Complaint  Patient presents with  . COVID exposure    HPI Leslie Daniels is a 53 y.o. female.   53 yo female with a c/o exposure to covid. States that her grandson tested positive to covid. Patient states she has not had any symptoms.      Past Medical History:  Diagnosis Date  . IDA (iron deficiency anemia) 2010  . Thyroid disease     Patient Active Problem List   Diagnosis Date Noted  . Impaired fasting glucose 12/10/2017  . Vitamin D deficiency 12/10/2017  . Screening for malignant neoplasm of cervix 12/10/2017  . Dysuria 12/10/2017  . Screening for measles 12/10/2017  . Fatigue 11/30/2017  . Encounter for general adult medical examination with abnormal findings   . Allergic rhinitis 10/14/2017  . Overweight 10/14/2017  . Screening for colon cancer 10/14/2017  . Iron deficiency anemia due to chronic blood loss 05/17/2015  . Anemia     Past Surgical History:  Procedure Laterality Date  . CESAREAN SECTION WITH BILATERAL TUBAL LIGATION Bilateral 1989  . COLONOSCOPY WITH PROPOFOL N/A 10/29/2017   Procedure: COLONOSCOPY WITH PROPOFOL;  Surgeon: Lucilla Lame, MD;  Location: [place];  Service: Endoscopy;  Laterality: N/A;    OB History   No obstetric history on file.      Home Medications    Prior to Admission medications   Medication Sig Start Date End Date Taking? Authorizing Provider  doxycycline (VIBRAMYCIN) 100 MG capsule Take 1 capsule (100 mg total) by mouth 2 (two) times daily. 01/04/18   Lorin Picket, PA-C  EPINEPHrine 0.3 mg/0.3 mL IJ SOAJ injection Inject 0.3 mLs (0.3 mg total) into the skin once. 11/13/15   Wynona Luna, MD  methylPREDNISolone (MEDROL DOSEPAK) 4 MG TBPK tablet Take per package instructions 01/04/18   Lorin Picket, PA-C  mupirocin ointment (BACTROBAN) 2 % Apply 1 application topically 3  (three) times daily. 01/04/18   Lorin Picket, PA-C  triamcinolone cream (KENALOG) 0.1 % Apply 1 application topically 2 (two) times daily. 01/04/18   Lorin Picket, PA-C    Family History Family History  Problem Relation Age of Onset  . Diabetes Other   . Diabetes Mother     Social History Social History   Tobacco Use  . Smoking status: Never Smoker  . Smokeless tobacco: Never Used  Substance Use Topics  . Alcohol use: No    Alcohol/week: 0.0 standard drinks    Comment: may have occasional drink on Holidays  . Drug use: No     Allergies   Patient has no known allergies.   Review of Systems Review of Systems   Physical Exam Triage Vital Signs ED Triage Vitals  Enc Vitals Group     BP 11/13/18 1752 (!) 143/88     Pulse Rate 11/13/18 1752 79     Resp 11/13/18 1752 18     Temp 11/13/18 1752 98.4 F (36.9 C)     Temp Source 11/13/18 1752 Oral     SpO2 11/13/18 1752 100 %     Weight 11/13/18 1748 225 lb (102.1 kg)     Height 11/13/18 1748 5\' 11"  (1.803 m)     Head Circumference --      Peak Flow --      Pain Score 11/13/18 1748 0  Pain Loc --      Pain Edu? --      Excl. in GC? --    No data found.  Updated Vital Signs BP (!) 143/88 (BP Location: Left Arm)   Pulse 79   Temp 98.4 F (36.9 C) (Oral)   Resp 18   Ht 5\' 11"  (1.803 m)   Wt 102.1 kg   LMP 08/14/2018 (Approximate)   SpO2 100%   BMI 31.38 kg/m   Visual Acuity Right Eye Distance:   Left Eye Distance:   Bilateral Distance:    Right Eye Near:   Left Eye Near:    Bilateral Near:     Physical Exam Vitals signs and nursing note reviewed.  Constitutional:      General: She is not in acute distress.    Appearance: She is not toxic-appearing or diaphoretic.  Pulmonary:     Effort: Pulmonary effort is normal. No respiratory distress.  Neurological:     Mental Status: She is alert.      UC Treatments / Results  Labs (all labs ordered are listed, but only abnormal results are  displayed) Labs Reviewed  NOVEL CORONAVIRUS, NAA    EKG   Radiology No results found.  Procedures Procedures (including critical care time)  Medications Ordered in UC Medications - No data to display  Initial Impression / Assessment and Plan / UC Course  I have reviewed the triage vital signs and the nursing notes.  Pertinent labs & imaging results that were available during my care of the patient were reviewed by me and considered in my medical decision making (see chart for details).      Final Clinical Impressions(s) / UC Diagnoses   Final diagnoses:  Exposure to Covid-19 Virus     Discharge Instructions     Monitor for any symptoms Self quarantine at least until test result is back    ED Prescriptions    None      1.  diagnosis reviewed with patient 2. Recommend supportive treatment as above 3. covid test pending 4. Follow-up prn if symptoms worsen or don't improve   Controlled Substance Prescriptions [place] Controlled Substance Registry consulted? Not Applicable   Payton Mccallumonty, [place], MD 11/13/18 1902

## 2018-11-13 NOTE — ED Triage Notes (Signed)
Pt states that her grandson tested positive for COVID on July 4th. She is asymptomatic at this time .

## 2018-11-16 LAB — NOVEL CORONAVIRUS, NAA (HOSP ORDER, SEND-OUT TO REF LAB; TAT 18-24 HRS): SARS-CoV-2, NAA: NOT DETECTED

## 2019-06-04 ENCOUNTER — Telehealth: Payer: Self-pay | Admitting: Dietician

## 2019-07-04 ENCOUNTER — Telehealth: Payer: Self-pay

## 2019-07-04 NOTE — Telephone Encounter (Signed)
CONFIRMED AND SCREENED FOR 07-08-19 OV. 

## 2019-07-08 ENCOUNTER — Encounter: Payer: Self-pay | Admitting: Nurse Practitioner

## 2019-07-08 ENCOUNTER — Ambulatory Visit: Payer: Managed Care, Other (non HMO) | Admitting: Nurse Practitioner

## 2019-07-08 ENCOUNTER — Other Ambulatory Visit: Payer: Self-pay | Admitting: Nurse Practitioner

## 2019-07-08 ENCOUNTER — Other Ambulatory Visit: Payer: Self-pay

## 2019-07-08 ENCOUNTER — Ambulatory Visit
Admission: RE | Admit: 2019-07-08 | Discharge: 2019-07-08 | Disposition: A | Discharge status: Home / Self Care | Payer: Managed Care, Other (non HMO) | Source: Ambulatory Visit | Attending: Nurse Practitioner | Admitting: Nurse Practitioner

## 2019-07-08 VITALS — BP 148/82 | HR 82 | Temp 96.7°F | Resp 16 | Ht 71.0 in | Wt 245.8 lb

## 2019-07-08 DIAGNOSIS — I10 Essential (primary) hypertension: Secondary | ICD-10-CM | POA: Insufficient documentation

## 2019-07-08 DIAGNOSIS — M5000 Cervical disc disorder with myelopathy, unspecified cervical region: Secondary | ICD-10-CM | POA: Diagnosis present

## 2019-07-08 DIAGNOSIS — Z1231 Encounter for screening mammogram for malignant neoplasm of breast: Secondary | ICD-10-CM | POA: Insufficient documentation

## 2019-07-08 DIAGNOSIS — R635 Abnormal weight gain: Secondary | ICD-10-CM | POA: Insufficient documentation

## 2019-07-08 DIAGNOSIS — L723 Sebaceous cyst: Secondary | ICD-10-CM | POA: Diagnosis not present

## 2019-07-08 MED ORDER — HYDROCHLOROTHIAZIDE 12.5 MG PO TABS: 12.5000 mg | ORAL_TABLET | Freq: Every day | ORAL | 3 refills | Status: DC

## 2019-07-08 NOTE — Progress Notes (Signed)
[facility] [address] [place], [place] [address]  Internal MEDICINE  Office Visit Note  Patient Name: Leslie Daniels  [id number]  [id number]  Date of Service: 07/08/2019  Chief Complaint  Patient presents with  . Follow-up  . Neck Pain    feels like a bone popped, and pressure     The patient is here for routine follow up. She has noted an increase in neck pain. She states when she turns her head from right to left,she can hear a crunching sound. Sometimes, when turning her head, gets  Up, or walks, she feels feels a sharp pin in the neck which radiates to the head. This will last for a few minutes and resolve on it's own. She had x-ray of her neck 01/2017, which did show diffuse degenerative changes at C4/C5 and C5/C6. There was soft tissue swelling also noted. She has not had any further imaging. Never saw orthopedic provider or physical therapy. Does not take anything to help the pain.  She did get her first moderna COVID vaccine 06/27/2019. She has developed a rash around the insertion site along with some swelling. It has gradually become worse over the past few days. She has been putting hydrocortisone cream on the effected area.  The patient's blood pressure is elevated. This has not been issue for her int he past. Wants to start back on appetite suppressant. She has gained a bit over 20 pounds since 11/2017 and would like to jump start new diet.       Current Medication: Outpatient Encounter Medications as of 07/08/2019  Medication Sig  . EPINEPHrine 0.3 mg/0.3 mL IJ SOAJ injection Inject 0.3 mLs (0.3 mg total) into the skin once.  . hydrochlorothiazide (HYDRODIURIL) 12.5 MG tablet Take 1 tablet (12.5 mg total) by mouth daily.  . [DISCONTINUED] doxycycline (VIBRAMYCIN) 100 MG capsule Take 1 capsule (100 mg total) by mouth 2 (two) times daily. (Patient not taking: Reported on 07/08/2019)  . [DISCONTINUED] methylPREDNISolone (MEDROL DOSEPAK) 4 MG TBPK tablet Take per  package instructions (Patient not taking: Reported on 07/08/2019)  . [DISCONTINUED] mupirocin ointment (BACTROBAN) 2 % Apply 1 application topically 3 (three) times daily. (Patient not taking: Reported on 07/08/2019)  . [DISCONTINUED] triamcinolone cream (KENALOG) 0.1 % Apply 1 application topically 2 (two) times daily. (Patient not taking: Reported on 07/08/2019)   No facility-administered encounter medications on file as of 07/08/2019.    Surgical History: Past Surgical History:  Procedure Laterality Date  . CESAREAN SECTION WITH BILATERAL TUBAL LIGATION Bilateral 1989  . COLONOSCOPY WITH PROPOFOL N/A 10/29/2017   Procedure: COLONOSCOPY WITH PROPOFOL;  Surgeon: Midge Minium, MD;  Location: [facility];  Service: Endoscopy;  Laterality: N/A;    Medical History: Past Medical History:  Diagnosis Date  . IDA (iron deficiency anemia) 2010  . Thyroid disease     Family History: Family History  Problem Relation Age of Onset  . Diabetes Other   . Diabetes Mother     Social History   Socioeconomic History  . Marital status: Married    Spouse name: Not on file  . Number of children: Not on file  . Years of education: Not on file  . Highest education level: Not on file  Occupational History  . Not on file  Tobacco Use  . Smoking status: Never Smoker  . Smokeless tobacco: Never Used  Substance and Sexual Activity  . Alcohol use: Yes    Alcohol/week: 0.0 standard drinks    Comment:  may have occasional drink on Holidays  . Drug use: No  . Sexual activity: Yes    Partners: Male  Other Topics Concern  . Not on file  Social History Narrative  . Not on file   Social Determinants of Health   Financial Resource Strain:   . Difficulty of Paying Living Expenses: Not on file  Food Insecurity:   . Worried About Programme researcher, broadcasting/film/video in the Last Year: Not on file  . Ran Out of Food in the Last Year: Not on file  Transportation Needs:   . Lack of Transportation (Medical): Not on  file  . Lack of Transportation (Non-Medical): Not on file  Physical Activity:   . Days of Exercise per Week: Not on file  . Minutes of Exercise per Session: Not on file  Stress:   . Feeling of Stress : Not on file  Social Connections:   . Frequency of Communication with Friends and Family: Not on file  . Frequency of Social Gatherings with Friends and Family: Not on file  . Attends Religious Services: Not on file  . Active Member of Clubs or Organizations: Not on file  . Attends Banker Meetings: Not on file  . Marital Status: Not on file  Intimate Partner Violence:   . Fear of Current or Ex-Partner: Not on file  . Emotionally Abused: Not on file  . Physically Abused: Not on file  . Sexually Abused: Not on file      Review of Systems  Constitutional: Positive for unexpected weight change. Negative for chills and fatigue.       Weight gain of over 20 pounds since visit 11/2017.   HENT: Negative for congestion, postnasal drip, rhinorrhea, sneezing and sore throat.   Respiratory: Negative for cough, chest tightness, shortness of breath and wheezing.   Cardiovascular: Negative for chest pain and palpitations.       Elevated blood pressure.  Gastrointestinal: Negative for abdominal pain, constipation, diarrhea, nausea and vomiting.  Endocrine: Negative for cold intolerance, heat intolerance, polydipsia and polyuria.  Musculoskeletal: Positive for neck pain and neck stiffness. Negative for arthralgias, back pain and joint swelling.  Skin: Negative for rash.       Two small sebaceous cysts on back of her neck.   Allergic/Immunologic: Negative for environmental allergies.  Neurological: Positive for headaches. Negative for dizziness, tremors and numbness.  Hematological: Negative for adenopathy. Does not bruise/bleed easily.  Psychiatric/Behavioral: Negative for behavioral problems (Depression), sleep disturbance and suicidal ideas. The patient is not nervous/anxious.      Today's Vitals   07/08/19 0828  BP: (!) 148/82  Pulse: 82  Resp: 16  Temp: (!) 96.7 F (35.9 C)  SpO2: 97%  Weight: 245 lb 12.8 oz (111.5 kg)  Height: 5\' 11"  (1.803 m)   Body mass index is 34.28 kg/m.  Physical Exam Vitals and nursing note reviewed.  Constitutional:      General: She is not in acute distress.    Appearance: Normal appearance. She is well-developed. She is not diaphoretic.  HENT:     Head: Normocephalic and atraumatic.     Nose: Nose normal.     Mouth/Throat:     Pharynx: No oropharyngeal exudate.  Eyes:     Pupils: Pupils are equal, round, and reactive to light.  Neck:     Thyroid: No thyromegaly.     Vascular: No carotid bruit or JVD.     Trachea: No tracheal deviation.   Cardiovascular:  Rate and Rhythm: Normal rate and regular rhythm.     Heart sounds: Normal heart sounds. No murmur. No friction rub. No gallop.   Pulmonary:     Effort: Pulmonary effort is normal. No respiratory distress.     Breath sounds: Normal breath sounds. No wheezing or rales.  Chest:     Chest wall: No tenderness.  Abdominal:     General: Bowel sounds are normal.     Palpations: Abdomen is soft.  Musculoskeletal:        General: Normal range of motion.     Cervical back: Normal range of motion and neck supple. Pain with movement, spinous process tenderness and muscular tenderness present.  Lymphadenopathy:     Cervical: No cervical adenopathy.  Skin:    General: Skin is warm and dry.  Neurological:     Mental Status: She is alert and oriented to person, place, and time.     Cranial Nerves: No cranial nerve deficit.  Psychiatric:        Mood and Affect: Mood normal.        Behavior: Behavior normal.        Thought Content: Thought content normal.        Judgment: Judgment normal.   Assessment/Plan: 1. Essential hypertension Start hydrochlorothiazide 12.5mg  tablets daily. Advised she limit salt in her diet and increase water intake. Add exercise to daily  routine.  - hydrochlorothiazide (HYDRODIURIL) 12.5 MG tablet; Take 1 tablet (12.5 mg total) by mouth daily.  Dispense: 30 tablet; Refill: 3  2. Cervical disc disease with myelopathy Will get x-ray of cervical spine for further evaluation. Refer to orthopedics as indicated.  - DG Cervical Spine Complete; Future  3. Abnormal weight gain Check labs, including thyroid panel for further evaluation. Consider adding appetite suppressant at next visit if blood pressure and labs good.   4. Sebaceous cyst Left posterior neck. Will monitor   5. Encounter for screening mammogram for malignant neoplasm of breast - MM DIGITAL SCREENING BILATERAL; Future  General Counseling: Jamita verbalizes understanding of the findings of todays visit and agrees with plan of treatment. I have discussed any further diagnostic evaluation that may be needed or ordered today. We also reviewed her medications today. she has been encouraged to call the office with any questions or concerns that should arise related to todays visit.  Hypertension Counseling:   The following hypertensive lifestyle modification were recommended and discussed:  1. Limiting alcohol intake to less than 1 oz/day of ethanol:(24 oz of beer or 8 oz of wine or 2 oz of 100-proof whiskey). 2. Take baby ASA 81 mg daily. 3. Importance of regular aerobic exercise and losing weight. 4. Reduce dietary saturated fat and cholesterol intake for overall cardiovascular health. 5. Maintaining adequate dietary potassium, calcium, and magnesium intake. 6. Regular monitoring of the blood pressure. 7. Reduce sodium intake to less than 100 mmol/day (less than 2.3 gm of sodium or less than 6 gm of sodium choride)   This patient was seen by [place] with Dr Lavera Guise as a part of collaborative care agreement  Orders Placed This Encounter  Procedures  . DG Cervical Spine Complete  . MM DIGITAL SCREENING BILATERAL    Meds ordered this  encounter  Medications  . hydrochlorothiazide (HYDRODIURIL) 12.5 MG tablet    Sig: Take 1 tablet (12.5 mg total) by mouth daily.    Dispense:  30 tablet    Refill:  3    Order Specific  Question:   Supervising Provider    Answer:   Lavera Guise [id number]    Total time spent: 30 Minutes   Time spent includes review of chart, medications, test results, and follow up plan with the patient.      Dr Lavera Guise Internal medicine

## 2019-07-09 LAB — CBC
Hematocrit: 38.5 % (ref 34.0–46.6)
Hemoglobin: 12.2 g/dL (ref 11.1–15.9)
MCH: 24.4 pg — ABNORMAL LOW (ref 26.6–33.0)
MCHC: 31.7 g/dL (ref 31.5–35.7)
MCV: 77 fL — ABNORMAL LOW (ref 79–97)
Platelets: 281 10*3/uL (ref 150–450)
RBC: 5.01 x10E6/uL (ref 3.77–5.28)
RDW: 15.6 % — ABNORMAL HIGH (ref 11.7–15.4)
WBC: 8.8 10*3/uL (ref 3.4–10.8)

## 2019-07-09 LAB — LIPID PANEL W/O CHOL/HDL RATIO
Cholesterol, Total: 207 mg/dL — ABNORMAL HIGH (ref 100–199)
HDL: 50 mg/dL (ref >39)
LDL Chol Calc (NIH): 132 mg/dL — ABNORMAL HIGH (ref 0–99)
Triglycerides: 142 mg/dL (ref 0–149)
VLDL Cholesterol Cal: 25 mg/dL (ref 5–40)

## 2019-07-09 LAB — COMPREHENSIVE METABOLIC PANEL
ALT: 13 IU/L (ref 0–32)
AST: 13 IU/L (ref 0–40)
Albumin/Globulin Ratio: 1.5 (ref 1.2–2.2)
Albumin: 4.4 g/dL (ref 3.8–4.9)
Alkaline Phosphatase: 113 IU/L (ref 39–117)
BUN/Creatinine Ratio: 9 (ref 9–23)
BUN: 10 mg/dL (ref 6–24)
Bilirubin Total: 0.4 mg/dL (ref 0.0–1.2)
CO2: 23 mmol/L (ref 20–29)
Calcium: 9.5 mg/dL (ref 8.7–10.2)
Chloride: 103 mmol/L (ref 96–106)
Creatinine, Ser: 1.06 mg/dL — ABNORMAL HIGH (ref 0.57–1.00)
GFR calc Af Amer: 70 mL/min/{1.73_m2} (ref >59)
GFR calc non Af Amer: 61 mL/min/{1.73_m2} (ref >59)
Globulin, Total: 3 g/dL (ref 1.5–4.5)
Glucose: 85 mg/dL (ref 65–99)
Potassium: 4.2 mmol/L (ref 3.5–5.2)
Sodium: 141 mmol/L (ref 134–144)
Total Protein: 7.4 g/dL (ref 6.0–8.5)

## 2019-07-09 LAB — VITAMIN D 25 HYDROXY (VIT D DEFICIENCY, FRACTURES): Vit D, 25-Hydroxy: 8.9 ng/mL — ABNORMAL LOW (ref 30.0–100.0)

## 2019-07-09 LAB — FERRITIN: Ferritin: 62 ng/mL (ref 15–150)

## 2019-07-09 LAB — B12 AND FOLATE PANEL
Folate: 6.4 ng/mL (ref >3.0)
Vitamin B-12: 209 pg/mL — ABNORMAL LOW (ref 232–1245)

## 2019-07-09 LAB — TSH: TSH: 0.849 u[IU]/mL (ref 0.450–4.500)

## 2019-07-09 LAB — IRON AND TIBC
Iron Saturation: 16 % (ref 15–55)
Iron: 56 ug/dL (ref 27–159)
Total Iron Binding Capacity: 348 ug/dL (ref 250–450)
UIBC: 292 ug/dL (ref 131–425)

## 2019-07-09 LAB — T4, FREE: Free T4: 1.2 ng/dL (ref 0.82–1.77)

## 2019-07-09 NOTE — Progress Notes (Signed)
Some progressive, but not acute, changes of cervical spine.

## 2019-07-14 NOTE — Progress Notes (Signed)
Significant vitamin d deficiency. Discuss and treat at visit 08/05/2019

## 2019-07-31 ENCOUNTER — Telehealth: Payer: Self-pay

## 2019-07-31 NOTE — Telephone Encounter (Signed)
Confirmed and screened for 08-05-19 ov. 

## 2019-08-05 ENCOUNTER — Other Ambulatory Visit: Payer: Self-pay

## 2019-08-05 ENCOUNTER — Encounter: Payer: Self-pay | Admitting: Nurse Practitioner

## 2019-08-05 ENCOUNTER — Ambulatory Visit (INDEPENDENT_AMBULATORY_CARE_PROVIDER_SITE_OTHER): Payer: Managed Care, Other (non HMO) | Admitting: Nurse Practitioner

## 2019-08-05 VITALS — BP 138/82 | HR 67 | Temp 96.4°F | Resp 16 | Ht 71.0 in | Wt 244.8 lb

## 2019-08-05 DIAGNOSIS — M5000 Cervical disc disorder with myelopathy, unspecified cervical region: Secondary | ICD-10-CM | POA: Diagnosis not present

## 2019-08-05 DIAGNOSIS — Z0001 Encounter for general adult medical examination with abnormal findings: Secondary | ICD-10-CM

## 2019-08-05 DIAGNOSIS — Z6834 Body mass index (BMI) 34.0-34.9, adult: Secondary | ICD-10-CM

## 2019-08-05 DIAGNOSIS — N6001 Solitary cyst of right breast: Secondary | ICD-10-CM

## 2019-08-05 DIAGNOSIS — E538 Deficiency of other specified B group vitamins: Secondary | ICD-10-CM | POA: Diagnosis not present

## 2019-08-05 DIAGNOSIS — I10 Essential (primary) hypertension: Secondary | ICD-10-CM

## 2019-08-05 DIAGNOSIS — R300 Dysuria: Secondary | ICD-10-CM

## 2019-08-05 DIAGNOSIS — E559 Vitamin D deficiency, unspecified: Secondary | ICD-10-CM | POA: Diagnosis not present

## 2019-08-05 MED ORDER — PHENTERMINE HCL 37.5 MG PO TABS: 37.5000 mg | ORAL_TABLET | Freq: Every day | ORAL | 0 refills | Status: DC

## 2019-08-05 MED ORDER — ERGOCALCIFEROL 1.25 MG (50000 UT) PO CAPS: 50000.0000 [IU] | ORAL_CAPSULE | ORAL | 5 refills | Status: DC

## 2019-08-05 MED ORDER — CYANOCOBALAMIN 1000 MCG/ML IJ SOLN
1000.0000 ug | Freq: Once | INTRAMUSCULAR | Status: AC
  Administered 2019-08-05: 1000 ug via INTRAMUSCULAR

## 2019-08-05 NOTE — Progress Notes (Signed)
[facility] [address] [place], [place] [address]  Internal MEDICINE  Office Visit Note  Patient Name: Leslie Daniels  [id number]  [id number]  Date of Service: 08/16/2019   Pt is here for routine health maintenance examination  Chief Complaint  Patient presents with  . Annual Exam  . Hypertension     The patient is here for health maintenance exam. She has had labs done prior to this visit. She has significant vitamin d deficiency and also b12 deficiency. Her LDL and total cholesterol were mildly elevated. Discussed how diet and exercise will help to lower these levels without adding medication to treat cholesterol. Her other labs looked good.  She was started on HCTZ 12.5mg  tablets daily. She has done well with this medication. Blood pressure is lower than at prior check and she reports no negative side effects from taking this medication.  She continues to have increase in neck pain. She states when she turns her head from right to left,she can hear a crunching sound. Sometimes, when turning her head, gets  Up, or walks, she feels feels a sharp pin in the neck which radiates to the head. This will last for a few minutes and resolve on it's own. New x-ray of her neck shows that she has endplate spurring at C4, C5, and C6.     Current Medication: Outpatient Encounter Medications as of 08/05/2019  Medication Sig  . EPINEPHrine 0.3 mg/0.3 mL IJ SOAJ injection Inject 0.3 mLs (0.3 mg total) into the skin once.  . hydrochlorothiazide (HYDRODIURIL) 12.5 MG tablet Take 1 tablet (12.5 mg total) by mouth daily.  . ergocalciferol (DRISDOL) 1.25 MG (50000 UT) capsule Take 1 capsule (50,000 Units total) by mouth once a week.  . phentermine (ADIPEX-P) 37.5 MG tablet Take 1 tablet (37.5 mg total) by mouth daily before breakfast.  . [EXPIRED] cyanocobalamin ((VITAMIN B-12)) injection 1,000 mcg    No facility-administered encounter medications on file as of 08/05/2019.    Surgical  History: Past Surgical History:  Procedure Laterality Date  . CESAREAN SECTION WITH BILATERAL TUBAL LIGATION Bilateral 1989  . COLONOSCOPY WITH PROPOFOL N/A 10/29/2017   Procedure: COLONOSCOPY WITH PROPOFOL;  Surgeon: Midge Minium, MD;  Location: [facility];  Service: Endoscopy;  Laterality: N/A;    Medical History: Past Medical History:  Diagnosis Date  . IDA (iron deficiency anemia) 2010  . Thyroid disease     Family History: Family History  Problem Relation Age of Onset  . Diabetes Other   . Diabetes Mother       Review of Systems  Constitutional: Negative for chills, fatigue and unexpected weight change.       One pound weight loss since last visit.  HENT: Negative for congestion, postnasal drip, rhinorrhea, sneezing and sore throat.   Respiratory: Negative for cough, chest tightness, shortness of breath and wheezing.   Cardiovascular: Negative for chest pain and palpitations.       Improved blood pressure.  Gastrointestinal: Negative for abdominal pain, constipation, diarrhea, nausea and vomiting.  Endocrine: Negative for cold intolerance, heat intolerance, polydipsia and polyuria.  Genitourinary: Negative for dysuria, frequency, hematuria and urgency.       States that it has been 11 months since her last menstrual period.   Musculoskeletal: Positive for neck pain and neck stiffness. Negative for arthralgias, back pain and joint swelling.  Skin: Negative for rash.  Allergic/Immunologic: Negative for environmental allergies.  Neurological: Positive for headaches. Negative for dizziness, tremors and numbness.  Hematological: Negative for adenopathy. Does not bruise/bleed easily.  Psychiatric/Behavioral: Negative for behavioral problems (Depression), sleep disturbance and suicidal ideas. The patient is not nervous/anxious.     Today's Vitals   08/05/19 0838  BP: 138/82  Pulse: 67  Resp: 16  Temp: (!) 96.4 F (35.8 C)  SpO2: 99%  Weight: 244 lb 12.8 oz (111  kg)  Height: 5\' 11"  (1.803 m)   Body mass index is 34.14 kg/m.  Physical Exam Vitals and nursing note reviewed.  Constitutional:      General: She is not in acute distress.    Appearance: Normal appearance. She is well-developed. She is not diaphoretic.  HENT:     Head: Normocephalic and atraumatic.     Mouth/Throat:     Pharynx: No oropharyngeal exudate.  Eyes:     Pupils: Pupils are equal, round, and reactive to light.  Neck:     Thyroid: No thyromegaly.     Vascular: No carotid bruit or JVD.     Trachea: No tracheal deviation.  Cardiovascular:     Rate and Rhythm: Normal rate and regular rhythm.     Pulses: Normal pulses.     Heart sounds: Normal heart sounds. No murmur. No friction rub. No gallop.   Pulmonary:     Effort: Pulmonary effort is normal. No respiratory distress.     Breath sounds: Normal breath sounds. No wheezing or rales.  Chest:     Chest wall: No tenderness.     Breasts:        Right: Normal. No swelling, bleeding, inverted nipple, nipple discharge, skin change or tenderness.        Left: Normal. No swelling, bleeding, inverted nipple, mass, nipple discharge, skin change or tenderness.    Abdominal:     General: Bowel sounds are normal.     Palpations: Abdomen is soft.  Musculoskeletal:        General: Normal range of motion.     Cervical back: Normal range of motion and neck supple.  Lymphadenopathy:     Cervical: No cervical adenopathy.     Upper Body:     Right upper body: No axillary adenopathy.     Left upper body: No axillary adenopathy.  Skin:    General: Skin is warm and dry.  Neurological:     Mental Status: She is alert and oriented to person, place, and time.     Cranial Nerves: No cranial nerve deficit.  Psychiatric:        Behavior: Behavior normal.        Thought Content: Thought content normal.        Judgment: Judgment normal.       LABS: Recent Results (from the past 2160 hour(s))  Comprehensive metabolic panel      Status: Abnormal   Collection Time: 07/08/19 10:03 AM  Result Value Ref Range   Glucose 85 65 - 99 mg/dL   BUN 10 6 - 24 mg/dL   Creatinine, Ser 4.091.06 (H) 0.57 - 1.00 mg/dL   GFR calc non Af Amer 61 >59 mL/min/1.73   GFR calc Af Amer 70 >59 mL/min/1.73   BUN/Creatinine Ratio 9 9 - 23   Sodium 141 134 - 144 mmol/L   Potassium 4.2 3.5 - 5.2 mmol/L   Chloride 103 96 - 106 mmol/L   CO2 23 20 - 29 mmol/L   Calcium 9.5 8.7 - 10.2 mg/dL   Total Protein 7.4 6.0 - 8.5 g/dL   Albumin 4.4 3.8 -  4.9 g/dL   Globulin, Total 3.0 1.5 - 4.5 g/dL   Albumin/Globulin Ratio 1.5 1.2 - 2.2   Bilirubin Total 0.4 0.0 - 1.2 mg/dL   Alkaline Phosphatase 113 39 - 117 IU/L   AST 13 0 - 40 IU/L   ALT 13 0 - 32 IU/L  CBC     Status: Abnormal   Collection Time: 07/08/19 10:03 AM  Result Value Ref Range   WBC 8.8 3.4 - 10.8 x10E3/uL   RBC 5.01 3.77 - 5.28 x10E6/uL   Hemoglobin 12.2 11.1 - 15.9 g/dL   Hematocrit 34.1 93.7 - 46.6 %   MCV 77 (L) 79 - 97 fL   MCH 24.4 (L) 26.6 - 33.0 pg   MCHC 31.7 31.5 - 35.7 g/dL   RDW 90.2 (H) 40.9 - 73.5 %   Platelets 281 150 - 450 x10E3/uL  Lipid Panel w/o Chol/HDL Ratio     Status: Abnormal   Collection Time: 07/08/19 10:03 AM  Result Value Ref Range   Cholesterol, Total 207 (H) 100 - 199 mg/dL   Triglycerides 329 0 - 149 mg/dL   HDL 50 >92 mg/dL   VLDL Cholesterol Cal 25 5 - 40 mg/dL   LDL Chol Calc (NIH) 426 (H) 0 - 99 mg/dL  Iron and TIBC     Status: None   Collection Time: 07/08/19 10:03 AM  Result Value Ref Range   Total Iron Binding Capacity 348 250 - 450 ug/dL   UIBC 834 196 - 222 ug/dL   Iron 56 27 - 979 ug/dL   Iron Saturation 16 15 - 55 %  B12 and Folate Panel     Status: Abnormal   Collection Time: 07/08/19 10:03 AM  Result Value Ref Range   Vitamin B-12 209 (L) 232 - 1,245 pg/mL   Folate 6.4 >3.0 ng/mL    Comment: A serum folate concentration of less than 3.1 ng/mL is considered to represent clinical deficiency.   T4, free     Status: None    Collection Time: 07/08/19 10:03 AM  Result Value Ref Range   Free T4 1.20 0.82 - 1.77 ng/dL  TSH     Status: None   Collection Time: 07/08/19 10:03 AM  Result Value Ref Range   TSH 0.849 0.450 - 4.500 uIU/mL  VITAMIN D 25 Hydroxy (Vit-D Deficiency, Fractures)     Status: Abnormal   Collection Time: 07/08/19 10:03 AM  Result Value Ref Range   Vit D, 25-Hydroxy 8.9 (L) 30.0 - 100.0 ng/mL    Comment: Vitamin D deficiency has been defined by the Institute of Medicine and an Endocrine Society practice guideline as a level of serum 25-OH vitamin D less than 20 ng/mL (1,2). The Endocrine Society went on to further define vitamin D insufficiency as a level between 21 and 29 ng/mL (2). 1. IOM (Institute of Medicine). 2010. Dietary reference    intakes for calcium and D. [place]: The    [employer]. 2. Holick MF, Binkley [place], Bischoff-Ferrari HA, et al.    Evaluation, treatment, and prevention of vitamin D    deficiency: an Endocrine Society clinical practice    guideline. JCEM. 2011 Jul; 96(7):1911-30.   Ferritin     Status: None   Collection Time: 07/08/19 10:03 AM  Result Value Ref Range   Ferritin 62 15 - 150 ng/mL  Urinalysis, Routine w reflex microscopic     Status: None   Collection Time: 08/05/19  9:00 AM  Result Value Ref Range  Specific Gravity, UA 1.018 1.005 - 1.030   pH, UA 5.5 5.0 - 7.5   Color, UA Yellow Yellow   Appearance Ur Clear Clear   Leukocytes,UA Negative Negative   Protein,UA Negative Negative/Trace   Glucose, UA Negative Negative   Ketones, UA Negative Negative   RBC, UA Negative Negative   Bilirubin, UA Negative Negative   Urobilinogen, Ur 0.2 0.2 - 1.0 mg/dL   Nitrite, UA Negative Negative   Microscopic Examination Comment     Comment: Microscopic not indicated and not performed.   Assessment/Plan: 1. Encounter for general adult medical examination with abnormal findings Annual health maintenance exam today.   2. Cervical disc  disease with myelopathy New x-ray of the neck showing endplate spurring at C4, C5, and C6. Offered referral to orthopedics for further evaluation. Patient prefers to hold off on this for now.    3. BMI 34.0-34.9,adult May take phentermine daily. Advised she limit calorie intake to 1200 calories per day and incoporate exercise into daily routine.  - phentermine (ADIPEX-P) 37.5 MG tablet; Take 1 tablet (37.5 mg total) by mouth daily before breakfast.  Dispense: 30 tablet; Refill: 0  4. Vitamin D deficiency Start drisdol 50000 iu weekly.  - ergocalciferol (DRISDOL) 1.25 MG (50000 UT) capsule; Take 1 capsule (50,000 Units total) by mouth once a week.  Dispense: 4 capsule; Refill: 5  5. B12 deficiency Will get vitamin b12 injections weekly for next four weeks, then once monthly after that.  - cyanocobalamin ((VITAMIN B-12)) injection 1,000 mcg  6. Essential hypertension Stable. Continue bp medication as prescribed   7. Benign breast cyst in female, right Patient scheduled for mammogram in May, 2021. Recommend additional images as indicated.   8. Dysuria - Urinalysis, Routine w reflex microscopic   General Counseling: Nataleigh verbalizes understanding of the findings of todays visit and agrees with plan of treatment. I have discussed any further diagnostic evaluation that may be needed or ordered today. We also reviewed her medications today. she has been encouraged to call the office with any questions or concerns that should arise related to todays visit.    Counseling:   There is a liability release in patients' chart. There has been a 10 minute discussion about the side effects including but not limited to elevated blood pressure, anxiety, lack of sleep and dry mouth. Pt understands and will like to start/continue on appetite suppressant at this time. There will be one month RX given at the time of visit with proper follow up. Nova diet plan with restricted calories is given to the  pt. Pt understands and agrees with  plan of treatment  This patient was seen by Leretha Pol FNP Collaboration with Dr Lavera Guise as a part of collaborative care agreement  Orders Placed This Encounter  Procedures  . Urinalysis, Routine w reflex microscopic    Meds ordered this encounter  Medications  . ergocalciferol (DRISDOL) 1.25 MG (50000 UT) capsule    Sig: Take 1 capsule (50,000 Units total) by mouth once a week.    Dispense:  4 capsule    Refill:  5    Order Specific Question:   Supervising Provider    Answer:   Lavera Guise [[id number]]  . phentermine (ADIPEX-P) 37.5 MG tablet    Sig: Take 1 tablet (37.5 mg total) by mouth daily before breakfast.    Dispense:  30 tablet    Refill:  0    Order Specific Question:   Supervising Provider  Answer:   Lyndon Code [[id number]]  . cyanocobalamin ((VITAMIN B-12)) injection 1,000 mcg    Total time spent: 45 Minutes  Time spent includes review of chart, medications, test results, and follow up plan with the patient.     Lyndon Code, MD  Internal Medicine

## 2019-08-06 LAB — URINALYSIS, ROUTINE W REFLEX MICROSCOPIC
Bilirubin, UA: NEGATIVE
Glucose, UA: NEGATIVE
Ketones, UA: NEGATIVE
Leukocytes,UA: NEGATIVE
Nitrite, UA: NEGATIVE
Protein,UA: NEGATIVE
RBC, UA: NEGATIVE
Specific Gravity, UA: 1.018 (ref 1.005–1.030)
Urobilinogen, Ur: 0.2 mg/dL (ref 0.2–1.0)
pH, UA: 5.5 (ref 5.0–7.5)

## 2019-08-12 ENCOUNTER — Other Ambulatory Visit: Payer: Self-pay

## 2019-08-12 ENCOUNTER — Ambulatory Visit (INDEPENDENT_AMBULATORY_CARE_PROVIDER_SITE_OTHER): Payer: Managed Care, Other (non HMO)

## 2019-08-12 DIAGNOSIS — E538 Deficiency of other specified B group vitamins: Secondary | ICD-10-CM | POA: Diagnosis not present

## 2019-08-12 MED ORDER — CYANOCOBALAMIN 1000 MCG/ML IJ SOLN
1000.0000 ug | Freq: Once | INTRAMUSCULAR | Status: AC
  Administered 2019-08-12: 1000 ug via INTRAMUSCULAR

## 2019-08-16 DIAGNOSIS — E538 Deficiency of other specified B group vitamins: Secondary | ICD-10-CM | POA: Insufficient documentation

## 2019-08-16 DIAGNOSIS — Z6831 Body mass index (BMI) 31.0-31.9, adult: Secondary | ICD-10-CM | POA: Insufficient documentation

## 2019-08-16 DIAGNOSIS — Z6834 Body mass index (BMI) 34.0-34.9, adult: Secondary | ICD-10-CM | POA: Insufficient documentation

## 2019-08-16 DIAGNOSIS — N6001 Solitary cyst of right breast: Secondary | ICD-10-CM | POA: Insufficient documentation

## 2019-08-19 ENCOUNTER — Ambulatory Visit: Payer: Managed Care, Other (non HMO)

## 2019-08-22 ENCOUNTER — Other Ambulatory Visit: Payer: Self-pay

## 2019-08-22 ENCOUNTER — Ambulatory Visit (INDEPENDENT_AMBULATORY_CARE_PROVIDER_SITE_OTHER): Payer: Managed Care, Other (non HMO)

## 2019-08-22 DIAGNOSIS — E538 Deficiency of other specified B group vitamins: Secondary | ICD-10-CM | POA: Diagnosis not present

## 2019-08-22 MED ORDER — CYANOCOBALAMIN 1000 MCG/ML IJ SOLN
1000.0000 ug | Freq: Once | INTRAMUSCULAR | Status: AC
  Administered 2019-08-22: 14:00:00 1000 ug via INTRAMUSCULAR

## 2019-08-26 ENCOUNTER — Ambulatory Visit: Payer: Managed Care, Other (non HMO)

## 2019-08-29 ENCOUNTER — Ambulatory Visit: Payer: Managed Care, Other (non HMO)

## 2019-08-29 ENCOUNTER — Telehealth: Payer: Self-pay

## 2019-08-29 NOTE — Telephone Encounter (Signed)
Confirmed appointment on 09/02/2019 and screened for covid. kl[facility] 

## 2019-08-29 NOTE — Telephone Encounter (Signed)
Confirmed and screened for 09-02-19 ov by Selena Batten

## 2019-09-02 ENCOUNTER — Ambulatory Visit (INDEPENDENT_AMBULATORY_CARE_PROVIDER_SITE_OTHER): Payer: Managed Care, Other (non HMO) | Admitting: Nurse Practitioner

## 2019-09-02 ENCOUNTER — Other Ambulatory Visit: Payer: Self-pay

## 2019-09-02 ENCOUNTER — Encounter: Payer: Self-pay | Admitting: Nurse Practitioner

## 2019-09-02 VITALS — BP 143/71 | HR 85 | Temp 96.0°F | Resp 16 | Ht 71.0 in | Wt 234.4 lb

## 2019-09-02 DIAGNOSIS — I10 Essential (primary) hypertension: Secondary | ICD-10-CM | POA: Diagnosis not present

## 2019-09-02 DIAGNOSIS — E538 Deficiency of other specified B group vitamins: Secondary | ICD-10-CM | POA: Diagnosis not present

## 2019-09-02 DIAGNOSIS — Z6834 Body mass index (BMI) 34.0-34.9, adult: Secondary | ICD-10-CM

## 2019-09-02 DIAGNOSIS — Z6832 Body mass index (BMI) 32.0-32.9, adult: Secondary | ICD-10-CM

## 2019-09-02 MED ORDER — PHENTERMINE HCL 37.5 MG PO TABS: 37.5000 mg | ORAL_TABLET | Freq: Every day | ORAL | 0 refills | Status: DC

## 2019-09-02 MED ORDER — CYANOCOBALAMIN 1000 MCG/ML IJ SOLN: 1000.0000 ug | Freq: Once | INTRAMUSCULAR | Status: AC

## 2019-09-02 NOTE — Progress Notes (Signed)
[facility], [place] [address]  Internal MEDICINE  Office Visit Note  Patient Name: Leslie Daniels  [id number]  [id number]  Date of Service: 09/14/2019  Chief Complaint  Patient presents with  . Follow-up    weight management     The patient is here for follow up weight management. On phentermine. Had 10 pound weight loss since her last visit. No negative side effects related to medication. Needs to have b12 injection today. Her blood pressure is well managed.       Current Medication: Outpatient Encounter Medications as of 09/02/2019  Medication Sig  . EPINEPHrine 0.3 mg/0.3 mL IJ SOAJ injection Inject 0.3 mLs (0.3 mg total) into the skin once.  . ergocalciferol (DRISDOL) 1.25 MG (50000 UT) capsule Take 1 capsule (50,000 Units total) by mouth once a week.  . hydrochlorothiazide (HYDRODIURIL) 12.5 MG tablet Take 1 tablet (12.5 mg total) by mouth daily.  . phentermine (ADIPEX-P) 37.5 MG tablet Take 1 tablet (37.5 mg total) by mouth daily before breakfast.  . [DISCONTINUED] phentermine (ADIPEX-P) 37.5 MG tablet Take 1 tablet (37.5 mg total) by mouth daily before breakfast.   Facility-Administered Encounter Medications as of 09/02/2019  Medication  . cyanocobalamin ((VITAMIN B-12)) injection 1,000 mcg    Surgical History: Past Surgical History:  Procedure Laterality Date  . CESAREAN SECTION WITH BILATERAL TUBAL LIGATION Bilateral 1989  . COLONOSCOPY WITH PROPOFOL N/A 10/29/2017   Procedure: COLONOSCOPY WITH PROPOFOL;  Surgeon: Lucilla Lame, MD;  Location: [place];  Service: Endoscopy;  Laterality: N/A;    Medical History: Past Medical History:  Diagnosis Date  . IDA (iron deficiency anemia) 2010  . Thyroid disease     Family History: Family History  Problem Relation Age of Onset  . Diabetes Other   . Diabetes Mother   . Breast cancer Neg Hx     Social History   Socioeconomic History  . Marital status: Married     Spouse name: Not on file  . Number of children: Not on file  . Years of education: Not on file  . Highest education level: Not on file  Occupational History  . Not on file  Tobacco Use  . Smoking status: Never Smoker  . Smokeless tobacco: Never Used  Substance and Sexual Activity  . Alcohol use: Yes    Alcohol/week: 0.0 standard drinks    Comment: may have occasional drink on Holidays  . Drug use: No  . Sexual activity: Yes    Partners: Male  Other Topics Concern  . Not on file  Social History Narrative  . Not on file   Social Determinants of Health   Financial Resource Strain:   . Difficulty of Paying Living Expenses:   Food Insecurity:   . Worried About Charity fundraiser in the Last Year:   . Arboriculturist in the Last Year:   Transportation Needs:   . Film/video editor (Medical):   Marland Kitchen Lack of Transportation (Non-Medical):   Physical Activity:   . Days of Exercise per Week:   . Minutes of Exercise per Session:   Stress:   . Feeling of Stress :   Social Connections:   . Frequency of Communication with Friends and Family:   . Frequency of Social Gatherings with Friends and Family:   . Attends Religious Services:   . Active Member of Clubs or Organizations:   . Attends Archivist Meetings:   Marland Kitchen Marital Status:  Intimate Partner Violence:   . Fear of Current or Ex-Partner:   . Emotionally Abused:   Marland Kitchen Physically Abused:   . Sexually Abused:       Review of Systems  Constitutional: Negative for chills, fatigue and unexpected weight change.       Ten pound weight loss since her last visit   HENT: Negative for congestion, postnasal drip, rhinorrhea, sneezing and sore throat.   Respiratory: Negative for cough, chest tightness, shortness of breath and wheezing.   Cardiovascular: Negative for chest pain and palpitations.  Gastrointestinal: Negative for abdominal pain, constipation, diarrhea, nausea and vomiting.  Endocrine: Negative for cold  intolerance, heat intolerance, polydipsia and polyuria.  Musculoskeletal: Negative for arthralgias, back pain, joint swelling and neck pain.  Skin: Negative for rash.  Allergic/Immunologic: Negative for environmental allergies.  Neurological: Negative for dizziness, tremors, numbness and headaches.  Hematological: Negative for adenopathy. Does not bruise/bleed easily.  Psychiatric/Behavioral: Negative for behavioral problems (Depression), sleep disturbance and suicidal ideas. The patient is not nervous/anxious.     Today's Vitals   09/02/19 0909  BP: (!) 143/71  Pulse: 85  Resp: 16  Temp: (!) 96 F (35.6 C)  SpO2: 99%  Weight: 234 lb 6.4 oz (106.3 kg)  Height: 5\' 11"  (1.803 m)   Body mass index is 32.69 kg/m.  Physical Exam Vitals and nursing note reviewed.  Constitutional:      General: She is not in acute distress.    Appearance: Normal appearance. She is well-developed. She is not diaphoretic.  HENT:     Head: Normocephalic and atraumatic.     Mouth/Throat:     Pharynx: No oropharyngeal exudate.  Eyes:     Pupils: Pupils are equal, round, and reactive to light.  Neck:     Thyroid: No thyromegaly.     Vascular: No JVD.     Trachea: No tracheal deviation.  Cardiovascular:     Rate and Rhythm: Normal rate and regular rhythm.     Heart sounds: Normal heart sounds. No murmur. No friction rub. No gallop.   Pulmonary:     Effort: Pulmonary effort is normal. No respiratory distress.     Breath sounds: Normal breath sounds. No wheezing or rales.  Chest:     Chest wall: No tenderness.  Abdominal:     Palpations: Abdomen is soft.  Musculoskeletal:        General: Normal range of motion.     Cervical back: Normal range of motion and neck supple.  Lymphadenopathy:     Cervical: No cervical adenopathy.  Skin:    General: Skin is warm and dry.  Neurological:     Mental Status: She is alert and oriented to person, place, and time.     Cranial Nerves: No cranial nerve  deficit.  Psychiatric:        Mood and Affect: Mood normal.        Behavior: Behavior normal.        Thought Content: Thought content normal.        Judgment: Judgment normal.    Assessment/Plan: 1. Essential hypertension Stable. Continue bp medication as prescribed   2. B12 deficiency b12 injection administered at today's visit.  - cyanocobalamin ((VITAMIN B-12)) injection 1,000 mcg  3. BMI 32.0-32.9,adult Improving. May continue phentermine 37.5mg  tablets daily. Limit calorie intake to 1200 calories per day and continue to incorporate exercise into daily routine.  - phentermine (ADIPEX-P) 37.5 MG tablet; Take 1 tablet (37.5 mg total) by mouth daily  before breakfast.  Dispense: 30 tablet; Refill: 0  General Counseling: Leslie Daniels verbalizes understanding of the findings of todays visit and agrees with plan of treatment. I have discussed any further diagnostic evaluation that may be needed or ordered today. We also reviewed her medications today. she has been encouraged to call the office with any questions or concerns that should arise related to todays visit.   There is a liability release in patients' chart. There has been a 10 minute discussion about the side effects including but not limited to elevated blood pressure, anxiety, lack of sleep and dry mouth. Pt understands and will like to start/continue on appetite suppressant at this time. There will be one month RX given at the time of visit with proper follow up. Nova diet plan with restricted calories is given to the pt. Pt understands and agrees with  plan of treatment  This patient was seen by Vincent Gros FNP Collaboration with Dr Lyndon Code as a part of collaborative care agreement  Meds ordered this encounter  Medications  . cyanocobalamin ((VITAMIN B-12)) injection 1,000 mcg  . phentermine (ADIPEX-P) 37.5 MG tablet    Sig: Take 1 tablet (37.5 mg total) by mouth daily before breakfast.    Dispense:  30 tablet     Refill:  0    Order Specific Question:   Supervising Provider    Answer:   Lyndon Code [[id number]]    Total time spent: 20 Minutes   Time spent includes review of chart, medications, test results, and follow up plan with the patient.      Dr Lyndon Code Internal medicine

## 2019-09-09 ENCOUNTER — Ambulatory Visit
Admission: RE | Admit: 2019-09-09 | Discharge: 2019-09-09 | Disposition: A | Discharge status: Home / Self Care | Payer: Managed Care, Other (non HMO) | Source: Ambulatory Visit | Attending: Nurse Practitioner | Admitting: Nurse Practitioner

## 2019-09-09 ENCOUNTER — Other Ambulatory Visit: Payer: Self-pay

## 2019-09-09 DIAGNOSIS — Z1231 Encounter for screening mammogram for malignant neoplasm of breast: Secondary | ICD-10-CM | POA: Diagnosis present

## 2019-10-02 ENCOUNTER — Other Ambulatory Visit: Payer: Self-pay

## 2019-10-02 DIAGNOSIS — I10 Essential (primary) hypertension: Secondary | ICD-10-CM

## 2019-10-02 MED ORDER — HYDROCHLOROTHIAZIDE 12.5 MG PO TABS: 12.5000 mg | ORAL_TABLET | Freq: Every day | ORAL | 1 refills | Status: DC

## 2019-10-06 ENCOUNTER — Ambulatory Visit: Payer: Managed Care, Other (non HMO) | Admitting: Nurse Practitioner

## 2019-11-04 ENCOUNTER — Ambulatory Visit: Payer: Managed Care, Other (non HMO) | Admitting: Nurse Practitioner

## 2019-11-04 ENCOUNTER — Telehealth: Payer: Self-pay

## 2019-11-04 NOTE — Telephone Encounter (Signed)
Confirmed and screened for 11-06-19 ov. 

## 2019-11-06 ENCOUNTER — Other Ambulatory Visit: Payer: Self-pay

## 2019-11-06 ENCOUNTER — Encounter: Payer: Self-pay | Admitting: Nurse Practitioner

## 2019-11-06 ENCOUNTER — Ambulatory Visit: Payer: Managed Care, Other (non HMO) | Admitting: Nurse Practitioner

## 2019-11-06 VITALS — BP 139/87 | HR 77 | Temp 96.0°F | Resp 16 | Ht 71.0 in | Wt 227.0 lb

## 2019-11-06 DIAGNOSIS — R42 Dizziness and giddiness: Secondary | ICD-10-CM | POA: Insufficient documentation

## 2019-11-06 DIAGNOSIS — Z6831 Body mass index (BMI) 31.0-31.9, adult: Secondary | ICD-10-CM

## 2019-11-06 DIAGNOSIS — E538 Deficiency of other specified B group vitamins: Secondary | ICD-10-CM | POA: Diagnosis not present

## 2019-11-06 DIAGNOSIS — I10 Essential (primary) hypertension: Secondary | ICD-10-CM | POA: Diagnosis not present

## 2019-11-06 MED ORDER — MECLIZINE HCL 25 MG PO TABS: 25.0000 mg | ORAL_TABLET | Freq: Three times a day (TID) | ORAL | 1 refills | Status: DC | PRN

## 2019-11-06 MED ORDER — CYANOCOBALAMIN 1000 MCG/ML IJ SOLN
1000.0000 ug | Freq: Once | INTRAMUSCULAR | Status: AC
  Administered 2019-11-06: 1000 ug via INTRAMUSCULAR

## 2019-11-06 MED ORDER — PHENTERMINE HCL 37.5 MG PO TABS: 37.5000 mg | ORAL_TABLET | Freq: Every day | ORAL | 0 refills | Status: DC

## 2019-11-06 NOTE — Progress Notes (Signed)
[facility] [address] [place], [place] [address]  Internal MEDICINE  Office Visit Note  Patient Name: Leslie Daniels  [id number]  [id number]  Date of Service: 11/06/2019  Chief Complaint  Patient presents with  . Anemia  . Follow-up    4 week fu wt management   . Foot Problem    both big toes feel numb   . Dizziness    lightheaded    The patient is here for routine follow up visit.  - intermittent numbness in big toes. Mostly at night. Recent labs show not diabetic. She was b12 deficient. Did get four B12 injections, weekly for a month, but did not get any after that. Her last b12 injection was 07/2019 -intermittent dizziness. Nothing she can think of will make this better or worse.  -intermittently taking phentermine for weight management. Has not been taking it the past few weeks because she was afraid this was making her dizzy. Discontinuation did not make any difference. Has had a 7 pound weight loss since her last visit. She states that she has done well with this and reports no negative side effects.       Current Medication: Outpatient Encounter Medications as of 11/06/2019  Medication Sig  . EPINEPHrine 0.3 mg/0.3 mL IJ SOAJ injection Inject 0.3 mLs (0.3 mg total) into the skin once.  . ergocalciferol (DRISDOL) 1.25 MG (50000 UT) capsule Take 1 capsule (50,000 Units total) by mouth once a week.  . hydrochlorothiazide (HYDRODIURIL) 12.5 MG tablet Take 1 tablet (12.5 mg total) by mouth daily.  . phentermine (ADIPEX-P) 37.5 MG tablet Take 1 tablet (37.5 mg total) by mouth daily before breakfast.  . [DISCONTINUED] phentermine (ADIPEX-P) 37.5 MG tablet Take 1 tablet (37.5 mg total) by mouth daily before breakfast.  . meclizine (ANTIVERT) 25 MG tablet Take 1 tablet (25 mg total) by mouth 3 (three) times daily as needed for dizziness.   Facility-Administered Encounter Medications as of 11/06/2019  Medication  . cyanocobalamin ((VITAMIN B-12)) injection 1,000 mcg   . [COMPLETED] cyanocobalamin ((VITAMIN B-12)) injection 1,000 mcg    Surgical History: Past Surgical History:  Procedure Laterality Date  . CESAREAN SECTION WITH BILATERAL TUBAL LIGATION Bilateral 1989  . COLONOSCOPY WITH PROPOFOL N/A 10/29/2017   Procedure: COLONOSCOPY WITH PROPOFOL;  Surgeon: Midge Minium, MD;  Location: [facility];  Service: Endoscopy;  Laterality: N/A;    Medical History: Past Medical History:  Diagnosis Date  . IDA (iron deficiency anemia) 2010  . Thyroid disease     Family History: Family History  Problem Relation Age of Onset  . Diabetes Other   . Diabetes Mother   . Breast cancer Neg Hx     Social History   Socioeconomic History  . Marital status: Married    Spouse name: Not on file  . Number of children: Not on file  . Years of education: Not on file  . Highest education level: Not on file  Occupational History  . Not on file  Tobacco Use  . Smoking status: Never Smoker  . Smokeless tobacco: Never Used  Vaping Use  . Vaping Use: Never used  Substance and Sexual Activity  . Alcohol use: Yes    Alcohol/week: 0.0 standard drinks    Comment: may have occasional drink on Holidays  . Drug use: No  . Sexual activity: Yes    Partners: Male  Other Topics Concern  . Not on file  Social History Narrative  . Not on file  Social Determinants of Health   Financial Resource Strain:   . Difficulty of Paying Living Expenses:   Food Insecurity:   . Worried About Programme researcher, broadcasting/film/video in the Last Year:   . Barista in the Last Year:   Transportation Needs:   . Freight forwarder (Medical):   Marland Kitchen Lack of Transportation (Non-Medical):   Physical Activity:   . Days of Exercise per Week:   . Minutes of Exercise per Session:   Stress:   . Feeling of Stress :   Social Connections:   . Frequency of Communication with Friends and Family:   . Frequency of Social Gatherings with Friends and Family:   . Attends Religious Services:    . Active Member of Clubs or Organizations:   . Attends Banker Meetings:   Marland Kitchen Marital Status:   Intimate Partner Violence:   . Fear of Current or Ex-Partner:   . Emotionally Abused:   Marland Kitchen Physically Abused:   . Sexually Abused:       Review of Systems  Constitutional: Negative for chills, fatigue and unexpected weight change.       Seven pound weight loss since her last visit.   HENT: Negative for congestion, postnasal drip, rhinorrhea, sneezing and sore throat.   Respiratory: Negative for cough, chest tightness, shortness of breath and wheezing.   Cardiovascular: Negative for chest pain and palpitations.  Gastrointestinal: Negative for abdominal pain, constipation, diarrhea, nausea and vomiting.  Endocrine: Negative for cold intolerance, heat intolerance, polydipsia and polyuria.  Musculoskeletal: Negative for arthralgias, back pain, joint swelling and neck pain.  Skin: Negative for rash.  Allergic/Immunologic: Negative for environmental allergies.  Neurological: Negative for dizziness, tremors, numbness and headaches.       Intermittent tingling/numbness sensations in her toes and fingers. Has also had few episodes of vertigo.  Hematological: Negative for adenopathy. Does not bruise/bleed easily.  Psychiatric/Behavioral: Negative for behavioral problems (Depression), sleep disturbance and suicidal ideas. The patient is not nervous/anxious.     Today's Vitals   11/06/19 0854  BP: 139/87  Pulse: 77  Resp: 16  Temp: (!) 96 F (35.6 C)  SpO2: 100%  Weight: 227 lb (103 kg)  Height: 5\' 11"  (1.803 m)   Body mass index is 31.66 kg/m.  Physical Exam Vitals and nursing note reviewed.  Constitutional:      General: She is not in acute distress.    Appearance: Normal appearance. She is well-developed. She is not diaphoretic.  HENT:     Head: Normocephalic and atraumatic.     Ears:     Comments: Small amount of ear wax in both ear canals. No redness or evidence of  inflammation present.     Mouth/Throat:     Pharynx: No oropharyngeal exudate.  Eyes:     Pupils: Pupils are equal, round, and reactive to light.  Neck:     Thyroid: No thyromegaly.     Vascular: No JVD.     Trachea: No tracheal deviation.  Cardiovascular:     Rate and Rhythm: Normal rate and regular rhythm.     Heart sounds: Normal heart sounds. No murmur heard.  No friction rub. No gallop.   Pulmonary:     Effort: Pulmonary effort is normal. No respiratory distress.     Breath sounds: Normal breath sounds. No wheezing or rales.  Chest:     Chest wall: No tenderness.  Abdominal:     Palpations: Abdomen is soft.  Musculoskeletal:  General: Normal range of motion.     Cervical back: Normal range of motion and neck supple.  Lymphadenopathy:     Cervical: No cervical adenopathy.  Skin:    General: Skin is warm and dry.  Neurological:     General: No focal deficit present.     Mental Status: She is alert and oriented to person, place, and time.     Cranial Nerves: No cranial nerve deficit.  Psychiatric:        Mood and Affect: Mood normal.        Behavior: Behavior normal.        Thought Content: Thought content normal.        Judgment: Judgment normal.   Assessment/Plan: 1. Essential hypertension Stable. continue bp medication as prescribed   2. B12 deficiency Possibly cause of new symptoms. b12 injection administered today. Will continue with monthly b12 injections.  - cyanocobalamin ((VITAMIN B-12)) injection 1,000 mcg  3. Vertigo Add short term prescription for meclizine 25mg  which may be taken up to three times daily as needed for vertigo.  - meclizine (ANTIVERT) 25 MG tablet; Take 1 tablet (25 mg total) by mouth 3 (three) times daily as needed for dizziness.  Dispense: 45 tablet; Refill: 1  4. BMI 31.0-31.9,adult Continues to improve. May continue phentermine daily. Limit calorie intake to 1200 calories per day and incorporate exercise into daily routine.  -  phentermine (ADIPEX-P) 37.5 MG tablet; Take 1 tablet (37.5 mg total) by mouth daily before breakfast.  Dispense: 30 tablet; Refill: 0  General Counseling: Ranata verbalizes understanding of the findings of todays visit and agrees with plan of treatment. I have discussed any further diagnostic evaluation that may be needed or ordered today. We also reviewed her medications today. she has been encouraged to call the office with any questions or concerns that should arise related to todays visit.   This patient was seen by 01-14-1967 FNP Collaboration with Dr Vincent Gros as a part of collaborative care agreement  Meds ordered this encounter  Medications  . phentermine (ADIPEX-P) 37.5 MG tablet    Sig: Take 1 tablet (37.5 mg total) by mouth daily before breakfast.    Dispense:  30 tablet    Refill:  0    Order Specific Question:   Supervising Provider    Answer:   Lyndon Code [[id number]]  . meclizine (ANTIVERT) 25 MG tablet    Sig: Take 1 tablet (25 mg total) by mouth 3 (three) times daily as needed for dizziness.    Dispense:  45 tablet    Refill:  1    Order Specific Question:   Supervising Provider    Answer:   Lyndon Code [[id number]]  . cyanocobalamin ((VITAMIN B-12)) injection 1,000 mcg    Total time spent: 30 Minutes   Time spent includes review of chart, medications, test results, and follow up plan with the patient.      Dr Lyndon Code Internal medicine

## 2019-12-05 ENCOUNTER — Ambulatory Visit: Payer: Managed Care, Other (non HMO) | Admitting: Nurse Practitioner

## 2019-12-09 ENCOUNTER — Ambulatory Visit: Payer: Managed Care, Other (non HMO) | Admitting: Nurse Practitioner

## 2019-12-17 ENCOUNTER — Telehealth: Payer: Self-pay

## 2019-12-17 NOTE — Telephone Encounter (Signed)
LM full, unable to confirm office visit for 8/20

## 2019-12-19 ENCOUNTER — Ambulatory Visit: Payer: Managed Care, Other (non HMO) | Admitting: Nurse Practitioner

## 2020-01-06 ENCOUNTER — Telehealth: Payer: Self-pay

## 2020-01-06 NOTE — Telephone Encounter (Signed)
LMOM for OV on 9/10 °

## 2020-01-09 ENCOUNTER — Ambulatory Visit: Payer: Managed Care, Other (non HMO) | Admitting: Nurse Practitioner

## 2020-01-09 ENCOUNTER — Encounter: Payer: Self-pay | Admitting: Nurse Practitioner

## 2020-01-09 VITALS — BP 138/82 | HR 80 | Temp 97.8°F | Resp 16 | Ht 71.0 in | Wt 226.8 lb

## 2020-01-09 DIAGNOSIS — Z23 Encounter for immunization: Secondary | ICD-10-CM | POA: Diagnosis not present

## 2020-01-09 DIAGNOSIS — I10 Essential (primary) hypertension: Secondary | ICD-10-CM | POA: Diagnosis not present

## 2020-01-09 DIAGNOSIS — Z6831 Body mass index (BMI) 31.0-31.9, adult: Secondary | ICD-10-CM

## 2020-01-09 NOTE — Progress Notes (Signed)
[facility] [address] [place], [place] [address]  Internal MEDICINE  Office Visit Note  Patient Name: Leslie Daniels  [id number]  [id number]  Date of Service: 01/09/2020  Chief Complaint  Patient presents with  . Follow-up    paperwork filled out  . Quality Metric Gaps    HIV screening    The patient is here for routine follow up. She needs to have biometric paperwork completed as BMI is 31. She has been on phentermine and done OK with it. She knows she needs to follow a diet that is 1200 calories or less, and incorporate exercise into her routine. For now, she prefers to do this without addition of appetite suppressant.  She would like to get her flu shot while she is in the office today.       Current Medication: Outpatient Encounter Medications as of 01/09/2020  Medication Sig  . EPINEPHrine 0.3 mg/0.3 mL IJ SOAJ injection Inject 0.3 mLs (0.3 mg total) into the skin once.  . ergocalciferol (DRISDOL) 1.25 MG (50000 UT) capsule Take 1 capsule (50,000 Units total) by mouth once a week.  . hydrochlorothiazide (HYDRODIURIL) 12.5 MG tablet Take 1 tablet (12.5 mg total) by mouth daily.  . meclizine (ANTIVERT) 25 MG tablet Take 1 tablet (25 mg total) by mouth 3 (three) times daily as needed for dizziness.  . phentermine (ADIPEX-P) 37.5 MG tablet Take 1 tablet (37.5 mg total) by mouth daily before breakfast.   Facility-Administered Encounter Medications as of 01/09/2020  Medication  . cyanocobalamin ((VITAMIN B-12)) injection 1,000 mcg    Surgical History: Past Surgical History:  Procedure Laterality Date  . CESAREAN SECTION WITH BILATERAL TUBAL LIGATION Bilateral 1989  . COLONOSCOPY WITH PROPOFOL N/A 10/29/2017   Procedure: COLONOSCOPY WITH PROPOFOL;  Surgeon: Midge Minium, MD;  Location: [facility];  Service: Endoscopy;  Laterality: N/A;    Medical History: Past Medical History:  Diagnosis Date  . IDA (iron deficiency anemia) 2010  . Thyroid  disease     Family History: Family History  Problem Relation Age of Onset  . Diabetes Other   . Diabetes Mother   . Breast cancer Neg Hx     Social History   Socioeconomic History  . Marital status: Married    Spouse name: Not on file  . Number of children: Not on file  . Years of education: Not on file  . Highest education level: Not on file  Occupational History  . Not on file  Tobacco Use  . Smoking status: Never Smoker  . Smokeless tobacco: Never Used  Vaping Use  . Vaping Use: Never used  Substance and Sexual Activity  . Alcohol use: Yes    Alcohol/week: 0.0 standard drinks    Comment: may have occasional drink on Holidays  . Drug use: No  . Sexual activity: Yes    Partners: Male  Other Topics Concern  . Not on file  Social History Narrative  . Not on file   Social Determinants of Health   Financial Resource Strain:   . Difficulty of Paying Living Expenses: Not on file  Food Insecurity:   . Worried About Programme researcher, broadcasting/film/video in the Last Year: Not on file  . Ran Out of Food in the Last Year: Not on file  Transportation Needs:   . Lack of Transportation (Medical): Not on file  . Lack of Transportation (Non-Medical): Not on file  Physical Activity:   . Days of Exercise per Week: Not  on file  . Minutes of Exercise per Session: Not on file  Stress:   . Feeling of Stress : Not on file  Social Connections:   . Frequency of Communication with Friends and Family: Not on file  . Frequency of Social Gatherings with Friends and Family: Not on file  . Attends Religious Services: Not on file  . Active Member of Clubs or Organizations: Not on file  . Attends Banker Meetings: Not on file  . Marital Status: Not on file  Intimate Partner Violence:   . Fear of Current or Ex-Partner: Not on file  . Emotionally Abused: Not on file  . Physically Abused: Not on file  . Sexually Abused: Not on file      Review of Systems  Constitutional: Negative for  chills, fatigue and unexpected weight change.       One pound weight loss. No longer taking appetite suppressant.   HENT: Negative for congestion, postnasal drip, rhinorrhea, sneezing and sore throat.   Respiratory: Negative for cough, chest tightness, shortness of breath and wheezing.   Cardiovascular: Negative for chest pain and palpitations.  Gastrointestinal: Negative for abdominal pain, constipation, diarrhea, nausea and vomiting.  Endocrine: Negative for cold intolerance, heat intolerance, polydipsia and polyuria.  Musculoskeletal: Negative for arthralgias, back pain, joint swelling and neck pain.  Skin: Negative for rash.  Allergic/Immunologic: Negative for environmental allergies.  Neurological: Negative for dizziness, tremors, numbness and headaches.  Hematological: Negative for adenopathy. Does not bruise/bleed easily.  Psychiatric/Behavioral: Negative for behavioral problems (Depression), sleep disturbance and suicidal ideas. The patient is not nervous/anxious.     Today's Vitals   01/09/20 0932  BP: 138/82  Pulse: 80  Resp: 16  Temp: 97.8 F (36.6 C)  SpO2: 98%  Weight: 226 lb 12.8 oz (102.9 kg)  Height: 5\' 11"  (1.803 m)   Body mass index is 31.63 kg/m.  Physical Exam Vitals and nursing note reviewed.  Constitutional:      General: She is not in acute distress.    Appearance: Normal appearance. She is well-developed. She is not diaphoretic.  HENT:     Head: Normocephalic and atraumatic.     Mouth/Throat:     Pharynx: No oropharyngeal exudate.  Eyes:     Pupils: Pupils are equal, round, and reactive to light.  Neck:     Thyroid: No thyromegaly.     Vascular: No JVD.     Trachea: No tracheal deviation.  Cardiovascular:     Rate and Rhythm: Normal rate and regular rhythm.     Heart sounds: Normal heart sounds. No murmur heard.  No friction rub. No gallop.   Pulmonary:     Effort: Pulmonary effort is normal. No respiratory distress.     Breath sounds: Normal  breath sounds. No wheezing or rales.  Chest:     Chest wall: No tenderness.  Abdominal:     Palpations: Abdomen is soft.  Musculoskeletal:        General: Normal range of motion.     Cervical back: Normal range of motion and neck supple.  Lymphadenopathy:     Cervical: No cervical adenopathy.  Skin:    General: Skin is warm and dry.  Neurological:     Mental Status: She is alert and oriented to person, place, and time.     Cranial Nerves: No cranial nerve deficit.  Psychiatric:        Mood and Affect: Mood normal.        Behavior:  Behavior normal.        Thought Content: Thought content normal.        Judgment: Judgment normal.    Assessment/Plan: 1. Essential hypertension Stable. Continue bp medication as prescribed   2. BMI 31.0-31.9,adult Biometric screening form completed and returned to the patient. She should follow a 1200 calorie diet and incorporate exercise into her daily routine. May consider adding back appetite suppressant as indicated   3. Flu vaccine need Flu vaccine administered today. - Flu Vaccine MDCK QUAD PF  General Counseling: Lowella verbalizes understanding of the findings of todays visit and agrees with plan of treatment. I have discussed any further diagnostic evaluation that may be needed or ordered today. We also reviewed her medications today. she has been encouraged to call the office with any questions or concerns that should arise related to todays visit.  Obesity Counseling: Risk Assessment: An assessment of behavioral risk factors was made today and includes lack of exercise sedentary lifestyle, lack of portion control and poor dietary habits.  Risk Modification Advice: She was counseled on portion control guidelines. Restricting daily caloric intake to. . The detrimental long term effects of obesity on her health and ongoing poor compliance was also discussed with the patient.  This patient was seen by Vincent Gros FNP Collaboration with Dr  Lyndon Code as a part of collaborative care agreement  Orders Placed This Encounter  Procedures  . Flu Vaccine MDCK QUAD PF      Total time spent: 30 Minutes Time spent includes review of chart, medications, test results, and follow up plan with the patient.      Dr Lyndon Code Internal medicine

## 2020-07-06 ENCOUNTER — Encounter: Payer: Self-pay | Admitting: Hospice and Palliative Medicine

## 2020-07-06 ENCOUNTER — Ambulatory Visit: Payer: Managed Care, Other (non HMO) | Admitting: Hospice and Palliative Medicine

## 2020-07-06 VITALS — BP 138/80 | HR 82 | Temp 97.8°F | Resp 16 | Ht 71.0 in | Wt 232.0 lb

## 2020-07-06 DIAGNOSIS — E041 Nontoxic single thyroid nodule: Secondary | ICD-10-CM

## 2020-07-06 DIAGNOSIS — R5383 Other fatigue: Secondary | ICD-10-CM | POA: Diagnosis not present

## 2020-07-06 DIAGNOSIS — Z8674 Personal history of sudden cardiac arrest: Secondary | ICD-10-CM

## 2020-07-06 DIAGNOSIS — I10 Essential (primary) hypertension: Secondary | ICD-10-CM | POA: Diagnosis not present

## 2020-07-06 NOTE — Progress Notes (Signed)
[facility] [address] [place], [place] [address]  Internal MEDICINE  Office Visit Note  Patient Name: Leslie Daniels  [id number]  [id number]  Date of Service: 07/07/2020  Transitional care management//hospital follow-up   Chief Complaint  Patient presents with  . Hospitalization Follow-up    Heart attack  . Hypertension     HPI Pt is here for recent hospital follow up. Admitted to [facility] hospital 02/22-02/26 s/p cardiac arrest, unresponsive, V. Fib arrest, received ACLS support by EMT providers prior to being transported to [facility] LCx infarct//defibrialltor implanted Hypokalemia on admission--started on daily supplement Phentermine stopped at discharge Started on ASA, atorvastatin, Plavix, losartan, potassium and metoprolol at discharge Had cardiac MRI--requested to be reviewed by PCP for incidental finding of thyroid nodule 1.6 cm--has a known history of thyroid nodule, has been followed by endocrinology in the past--unclear if this is new finding  Feeling well since discharge, at first had some difficulty sleeping due to flash backs and nightmares but has improved--sleeping well at night now   Current Medication: Outpatient Encounter Medications as of 07/06/2020  Medication Sig  . aspirin 81 MG chewable tablet Chew 81 mg by mouth daily.  Marland Kitchen atorvastatin (LIPITOR) 80 MG tablet Take 80 mg by mouth daily.  . clopidogrel (PLAVIX) 75 MG tablet Take 75 mg by mouth daily.  Marland Kitchen EPINEPHrine 0.3 mg/0.3 mL IJ SOAJ injection Inject 0.3 mLs (0.3 mg total) into the skin once.  Marland Kitchen losartan (COZAAR) 50 MG tablet Take 50 mg by mouth daily.  . metoprolol succinate (TOPROL-XL) 25 MG 24 hr tablet Take 50 mg by mouth daily.  . [DISCONTINUED] ergocalciferol (DRISDOL) 1.25 MG (50000 UT) capsule Take 1 capsule (50,000 Units total) by mouth once a week. (Patient not taking: Reported on 07/06/2020)  . [DISCONTINUED] hydrochlorothiazide (HYDRODIURIL) 12.5 MG tablet Take 1 tablet (12.5  mg total) by mouth daily. (Patient not taking: Reported on 07/06/2020)  . [DISCONTINUED] meclizine (ANTIVERT) 25 MG tablet Take 1 tablet (25 mg total) by mouth 3 (three) times daily as needed for dizziness. (Patient not taking: Reported on 07/06/2020)  . [DISCONTINUED] phentermine (ADIPEX-P) 37.5 MG tablet Take 1 tablet (37.5 mg total) by mouth daily before breakfast.   Facility-Administered Encounter Medications as of 07/06/2020  Medication  . cyanocobalamin ((VITAMIN B-12)) injection 1,000 mcg    Surgical History: Past Surgical History:  Procedure Laterality Date  . CESAREAN SECTION WITH BILATERAL TUBAL LIGATION Bilateral 1989  . COLONOSCOPY WITH PROPOFOL N/A 10/29/2017   Procedure: COLONOSCOPY WITH PROPOFOL;  Surgeon: Midge Minium, MD;  Location: [facility];  Service: Endoscopy;  Laterality: N/A;    Medical History: Past Medical History:  Diagnosis Date  . Heart attack (HCC)   . Hypertension   . IDA (iron deficiency anemia) 2010  . Thyroid disease     Family History: Family History  Problem Relation Age of Onset  . Diabetes Other   . Diabetes Mother   . Breast cancer Neg Hx     Social History   Socioeconomic History  . Marital status: Married    Spouse name: Not on file  . Number of children: Not on file  . Years of education: Not on file  . Highest education level: Not on file  Occupational History  . Not on file  Tobacco Use  . Smoking status: Never Smoker  . Smokeless tobacco: Never Used  Vaping Use  . Vaping Use: Never used  Substance and Sexual Activity  . Alcohol use: Yes  Alcohol/week: 0.0 standard drinks    Comment: may have occasional drink on Holidays  . Drug use: No  . Sexual activity: Yes    Partners: Male  Other Topics Concern  . Not on file  Social History Narrative  . Not on file   Social Determinants of Health   Financial Resource Strain: Not on file  Food Insecurity: Not on file  Transportation Needs: Not on file  Physical  Activity: Not on file  Stress: Not on file  Social Connections: Not on file  Intimate Partner Violence: Not on file      Review of Systems  Constitutional: Negative for chills, diaphoresis and fatigue.  HENT: Negative for ear pain, postnasal drip and sinus pressure.   Eyes: Negative for photophobia, discharge, redness, itching and visual disturbance.  Respiratory: Negative for cough, shortness of breath and wheezing.   Cardiovascular: Negative for chest pain, palpitations and leg swelling.  Gastrointestinal: Negative for abdominal pain, constipation, diarrhea, nausea and vomiting.  Genitourinary: Negative for dysuria and flank pain.  Musculoskeletal: Negative for arthralgias, back pain, gait problem and neck pain.  Skin: Negative for color change.  Allergic/Immunologic: Negative for environmental allergies and food allergies.  Neurological: Negative for dizziness and headaches.  Hematological: Does not bruise/bleed easily.  Psychiatric/Behavioral: Negative for agitation, behavioral problems (depression) and hallucinations.    Vital Signs: BP 138/80   Pulse 82   Temp 97.8 F (36.6 C)   Resp 16   Ht 5\' 11"  (1.803 m)   Wt 232 lb (105.2 kg)   LMP 08/14/2018 (Approximate)   SpO2 99%   BMI 32.36 kg/m    Physical Exam Vitals reviewed.  Constitutional:      Appearance: Normal appearance. She is normal weight.  Cardiovascular:     Rate and Rhythm: Normal rate and regular rhythm.     Pulses: Normal pulses.     Heart sounds: Normal heart sounds.  Pulmonary:     Effort: Pulmonary effort is normal.     Breath sounds: Normal breath sounds.  Abdominal:     General: Abdomen is flat.     Palpations: Abdomen is soft.  Musculoskeletal:        General: Normal range of motion.     Cervical back: Normal range of motion.  Skin:    General: Skin is warm.  Neurological:     General: No focal deficit present.     Mental Status: She is alert and oriented to person, place, and time.  Mental status is at baseline.  Psychiatric:        Mood and Affect: Mood normal.        Behavior: Behavior normal.        Thought Content: Thought content normal.        Judgment: Judgment normal.    Assessment/Plan: 1. 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 Essential hypertension BP and HR well controlled, continue to closely monitor  3. Thyroid nodule Get updated thyroid 08/16/2018 for further evaluation of nodule found on cardiac MRI while hospitalized - US THYROID; Future  4. Other fatigue - Comprehensive Metabolic Panel (CMET) - CBC w/Diff/Platelet - Lipid Panel With LDL/HDL Ratio - TSH + free T4 - B12 - Vitamin D (25 hydroxy)  General Counseling: Lovada verbalizes understanding of the findings of todays visit and agrees with plan of treatment. I have discussed any further diagnostic evaluation that may be needed  or ordered today. We also reviewed her medications today. she has been encouraged to call the office with any questions or concerns that should arise related to todays visit.   Orders Placed This Encounter  Procedures  . US THYROID  . Comprehensive Metabolic Panel (CMET)  . CBC w/Diff/Platelet  . Lipid Panel With LDL/HDL Ratio  . TSH + free T4  . B12  . Vitamin D (25 hydroxy)      I have reviewed all medical records from hospital follow up including radiology reports and consults from other physicians. Appropriate follow up diagnostics will be scheduled as needed. Patient/ Family understands the plan of treatment.   Time spent 30 minutes.  This patient was seen by Leeanne Deed AGNP-C in Collaboration with Dr Lyndon Code as a part of collaborative care agreement  Lubertha Basque. [facility]

## 2020-07-07 ENCOUNTER — Encounter: Payer: Self-pay | Admitting: Hospice and Palliative Medicine

## 2020-07-08 ENCOUNTER — Other Ambulatory Visit: Payer: Self-pay | Admitting: Hospice and Palliative Medicine

## 2020-07-08 LAB — COMPREHENSIVE METABOLIC PANEL
ALT: 19 IU/L (ref 0–32)
AST: 14 IU/L (ref 0–40)
Albumin/Globulin Ratio: 1.5 (ref 1.2–2.2)
Albumin: 4.2 g/dL (ref 3.8–4.9)
Alkaline Phosphatase: 110 IU/L (ref 44–121)
BUN/Creatinine Ratio: 11 (ref 9–23)
BUN: 11 mg/dL (ref 6–24)
Bilirubin Total: 0.5 mg/dL (ref 0.0–1.2)
CO2: 23 mmol/L (ref 20–29)
Calcium: 9.2 mg/dL (ref 8.7–10.2)
Chloride: 106 mmol/L (ref 96–106)
Creatinine, Ser: 1 mg/dL (ref 0.57–1.00)
Globulin, Total: 2.8 g/dL (ref 1.5–4.5)
Glucose: 107 mg/dL — ABNORMAL HIGH (ref 65–99)
Potassium: 4.3 mmol/L (ref 3.5–5.2)
Sodium: 143 mmol/L (ref 134–144)
Total Protein: 7 g/dL (ref 6.0–8.5)
eGFR: 68 mL/min/{1.73_m2} (ref >59)

## 2020-07-08 LAB — CBC WITH DIFFERENTIAL/PLATELET
Basophils Absolute: 0 10*3/uL (ref 0.0–0.2)
Basos: 0 %
EOS (ABSOLUTE): 0.2 10*3/uL (ref 0.0–0.4)
Eos: 2 %
Hematocrit: 34.8 % (ref 34.0–46.6)
Hemoglobin: 11.3 g/dL (ref 11.1–15.9)
Immature Grans (Abs): 0 10*3/uL (ref 0.0–0.1)
Immature Granulocytes: 0 %
Lymphocytes Absolute: 3 10*3/uL (ref 0.7–3.1)
Lymphs: 32 %
MCH: 25 pg — ABNORMAL LOW (ref 26.6–33.0)
MCHC: 32.5 g/dL (ref 31.5–35.7)
MCV: 77 fL — ABNORMAL LOW (ref 79–97)
Monocytes Absolute: 0.4 10*3/uL (ref 0.1–0.9)
Monocytes: 4 %
Neutrophils Absolute: 5.7 10*3/uL (ref 1.4–7.0)
Neutrophils: 62 %
Platelets: 307 10*3/uL (ref 150–450)
RBC: 4.52 x10E6/uL (ref 3.77–5.28)
RDW: 13.7 % (ref 11.7–15.4)
WBC: 9.2 10*3/uL (ref 3.4–10.8)

## 2020-07-08 LAB — TSH+FREE T4
Free T4: 1.33 ng/dL (ref 0.82–1.77)
TSH: 0.685 u[IU]/mL (ref 0.450–4.500)

## 2020-07-08 LAB — LIPID PANEL WITH LDL/HDL RATIO
Cholesterol, Total: 135 mg/dL (ref 100–199)
HDL: 45 mg/dL (ref >39)
LDL Chol Calc (NIH): 76 mg/dL (ref 0–99)
LDL/HDL Ratio: 1.7 ratio (ref 0.0–3.2)
Triglycerides: 71 mg/dL (ref 0–149)
VLDL Cholesterol Cal: 14 mg/dL (ref 5–40)

## 2020-07-08 LAB — VITAMIN B12: Vitamin B-12: 254 pg/mL (ref 232–1245)

## 2020-07-08 LAB — VITAMIN D 25 HYDROXY (VIT D DEFICIENCY, FRACTURES): Vit D, 25-Hydroxy: 14 ng/mL — ABNORMAL LOW (ref 30.0–100.0)

## 2020-07-08 MED ORDER — VITAMIN D (ERGOCALCIFEROL) 1.25 MG (50000 UNIT) PO CAPS: 50000.0000 [IU] | ORAL_CAPSULE | ORAL | 0 refills | Status: DC

## 2020-07-08 NOTE — Progress Notes (Signed)
Please let her know that I have reviewed her labs. I have sent Vitamin D supplement she will take once weekly for 6 weeks. She may also continue with B12 injections at home, I sent refills for that as well. Thanks.

## 2020-07-09 ENCOUNTER — Telehealth: Payer: Self-pay

## 2020-07-09 NOTE — Telephone Encounter (Signed)
-----   Message from Theotis Burrow, NP sent at 07/08/2020  1:00 PM EST ----- Please let her know that I have reviewed her labs. I have sent Vitamin D supplement she will take once weekly for 6 weeks. She may also continue with B12 injections at home, I sent refills for that as well. Thanks.

## 2020-07-09 NOTE — Telephone Encounter (Signed)
Pt notified for result and also make appt for b12 once a week for 3 weeks and then once a month

## 2020-07-12 ENCOUNTER — Other Ambulatory Visit: Payer: Self-pay

## 2020-07-12 ENCOUNTER — Ambulatory Visit (INDEPENDENT_AMBULATORY_CARE_PROVIDER_SITE_OTHER): Payer: Managed Care, Other (non HMO)

## 2020-07-12 DIAGNOSIS — E538 Deficiency of other specified B group vitamins: Secondary | ICD-10-CM

## 2020-07-12 MED ORDER — CYANOCOBALAMIN 1000 MCG/ML IJ SOLN
1000.0000 ug | Freq: Once | INTRAMUSCULAR | Status: AC
  Administered 2020-07-12: 1000 ug via INTRAMUSCULAR

## 2020-07-19 ENCOUNTER — Ambulatory Visit (INDEPENDENT_AMBULATORY_CARE_PROVIDER_SITE_OTHER): Payer: Managed Care, Other (non HMO)

## 2020-07-19 ENCOUNTER — Other Ambulatory Visit: Payer: Self-pay

## 2020-07-19 DIAGNOSIS — E538 Deficiency of other specified B group vitamins: Secondary | ICD-10-CM

## 2020-07-19 MED ORDER — CYANOCOBALAMIN 1000 MCG/ML IJ SOLN
1000.0000 ug | Freq: Once | INTRAMUSCULAR | Status: AC
  Administered 2020-07-19: 1000 ug via INTRAMUSCULAR

## 2020-07-26 ENCOUNTER — Ambulatory Visit (INDEPENDENT_AMBULATORY_CARE_PROVIDER_SITE_OTHER): Payer: Managed Care, Other (non HMO)

## 2020-07-26 ENCOUNTER — Other Ambulatory Visit: Payer: Self-pay

## 2020-07-26 DIAGNOSIS — E538 Deficiency of other specified B group vitamins: Secondary | ICD-10-CM | POA: Diagnosis not present

## 2020-07-26 IMAGING — MG DIGITAL SCREENING BILAT W/ TOMO W/ CAD
8 series · 8 of 24 positions shown · non-contrast
Comparison: Previous exam(s).

CLINICAL DATA: Screening.

EXAM:
DIGITAL SCREENING BILATERAL MAMMOGRAM WITH TOMO AND CAD

[R MLO synth-2D]
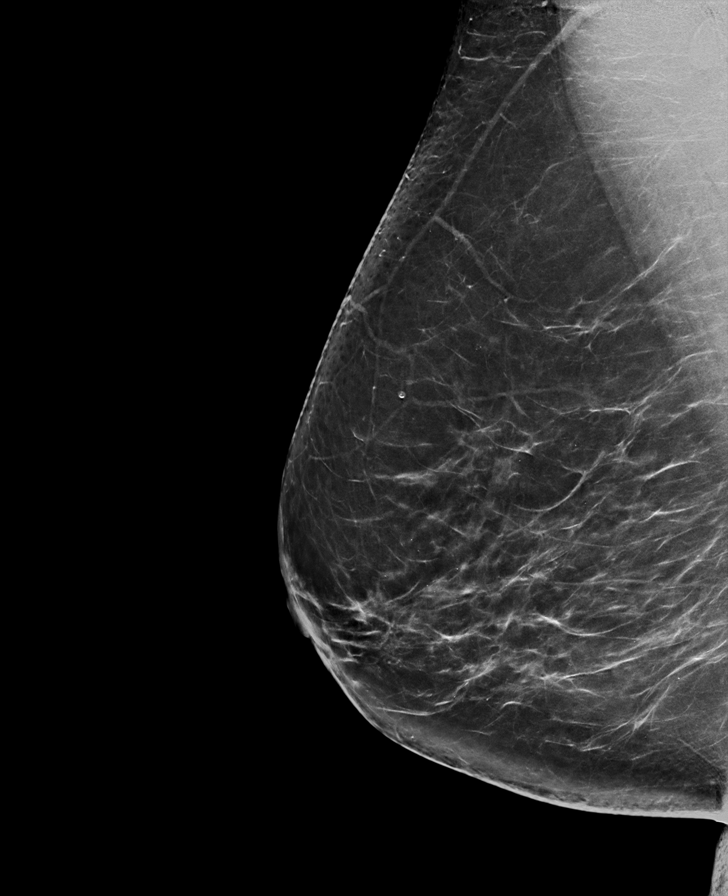

[R CC synth-2D]
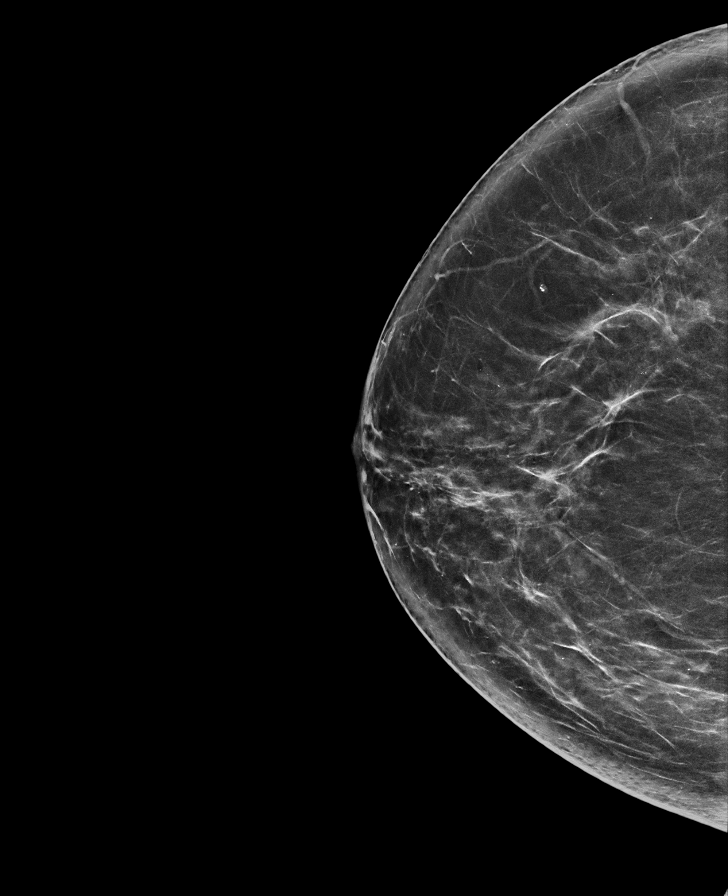

[L CC synth-2D]
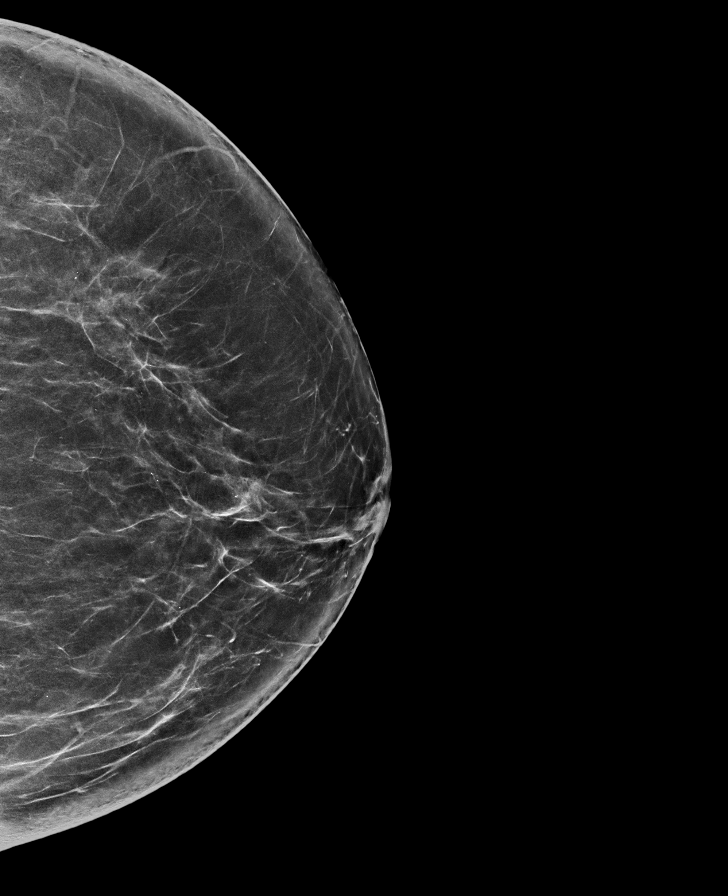

[L MLO synth-2D]
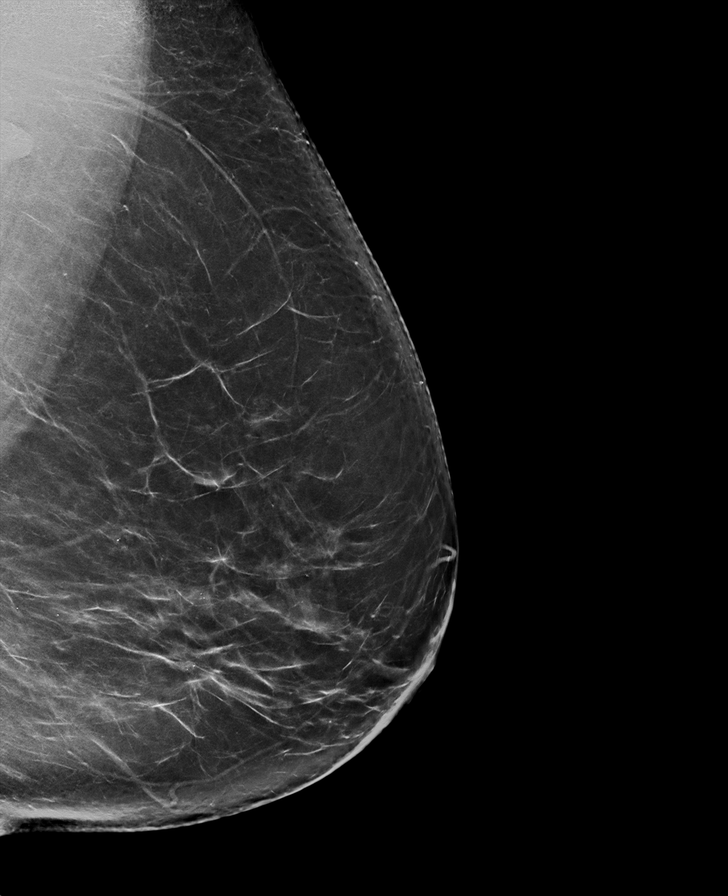

[R CC tomo · tomo slice 39/78.0]
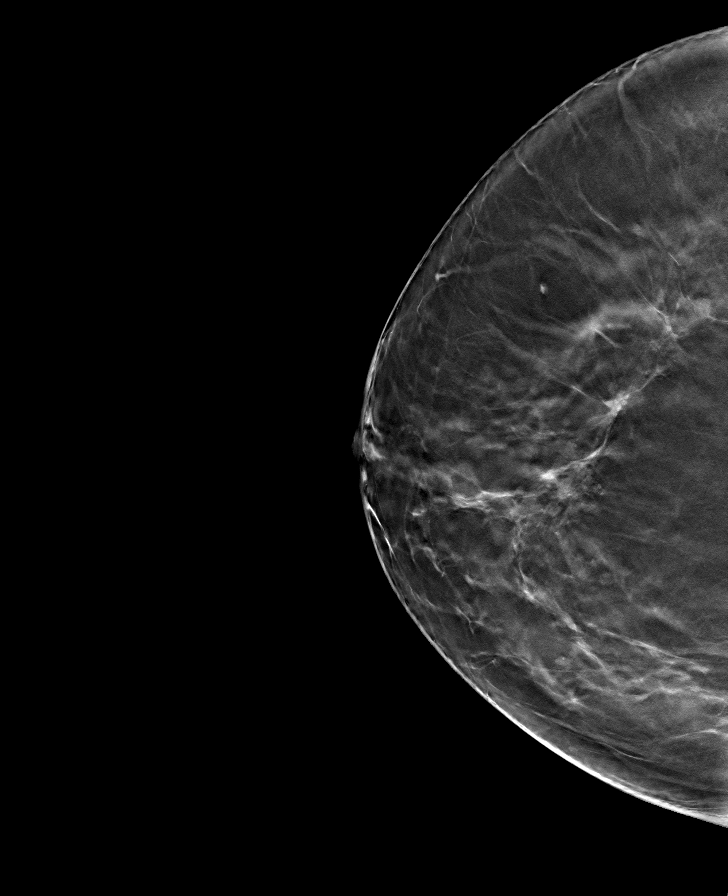

[L MLO tomo · tomo slice 46/91.0]
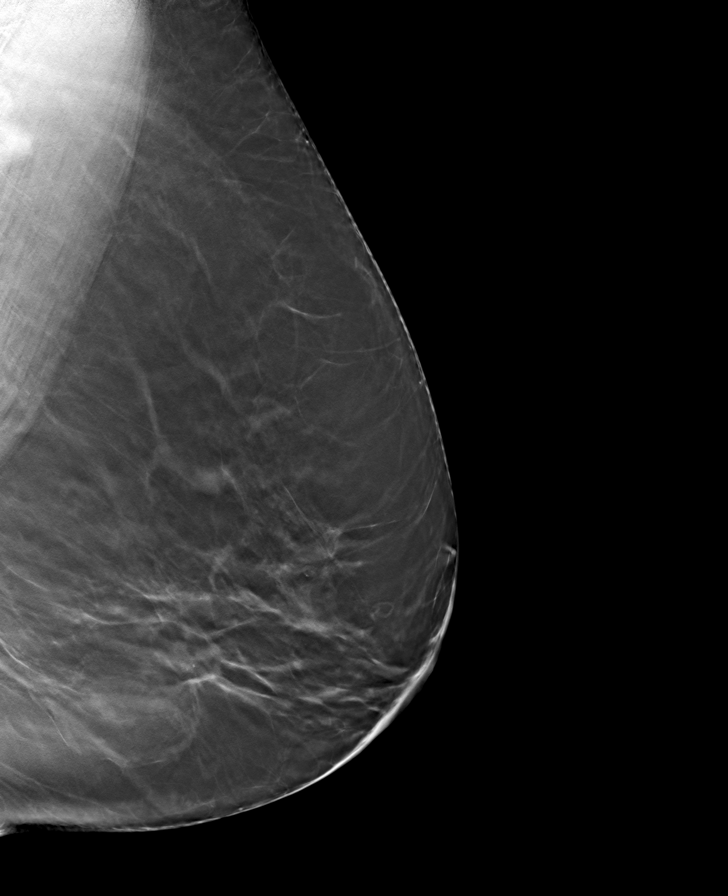

[R MLO tomo · tomo slice 45/88.0]
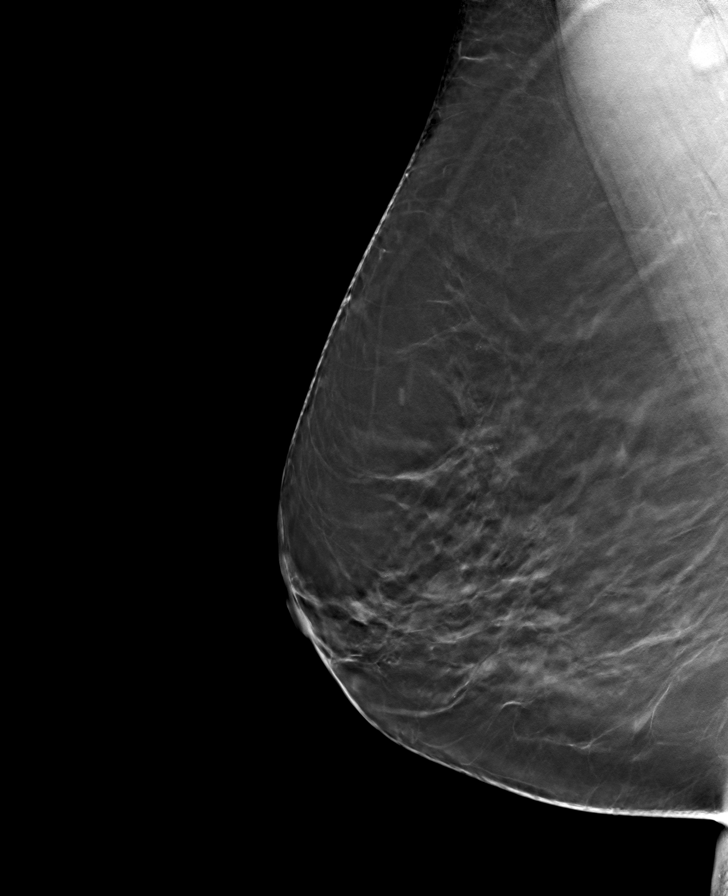

[L CC tomo · tomo slice 40/79.0]
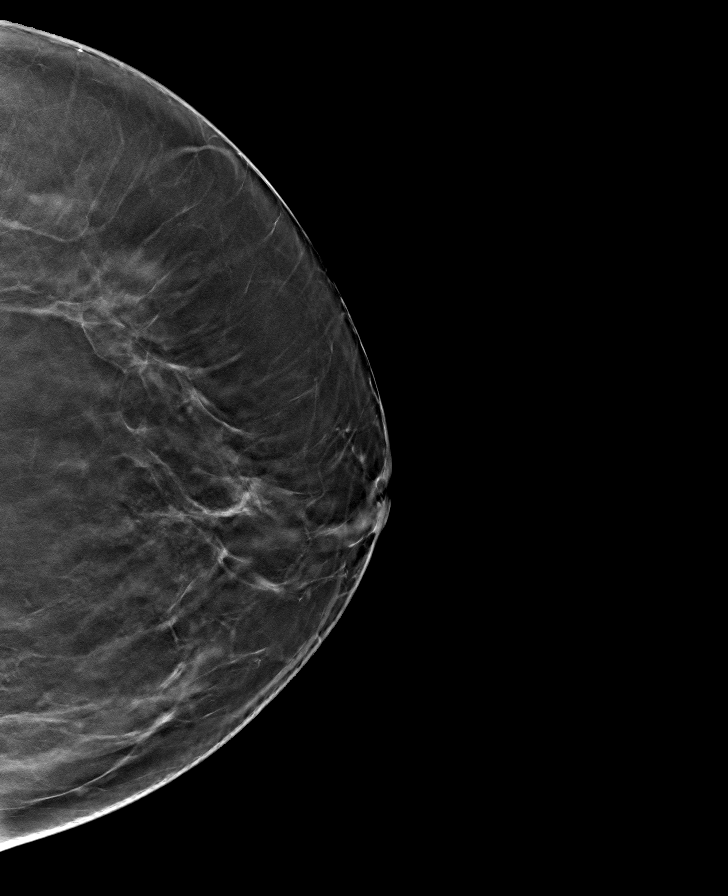

[8 of 24 positions shown; findings below may reference images not displayed]

ACR Breast Density Category b: There are scattered areas of
fibroglandular density.
FINDINGS: There are no findings suspicious for malignancy. Images were
processed with CAD.
IMPRESSION: No mammographic evidence of malignancy. A result letter of this
screening mammogram will be mailed directly to the patient.

RECOMMENDATION:
Screening mammogram in one year. (Code:[id number])

BI-RADS CATEGORY  1: Negative.

## 2020-07-26 MED ORDER — CYANOCOBALAMIN 1000 MCG/ML IJ SOLN
1000.0000 ug | Freq: Once | INTRAMUSCULAR | Status: AC
Start: 2020-07-26 — End: 2020-07-26
  Administered 2020-07-26: 1000 ug via INTRAMUSCULAR

## 2020-07-28 ENCOUNTER — Other Ambulatory Visit: Payer: Managed Care, Other (non HMO)

## 2020-08-03 ENCOUNTER — Ambulatory Visit: Payer: Managed Care, Other (non HMO) | Admitting: Internal Medicine

## 2020-08-05 ENCOUNTER — Encounter: Payer: Managed Care, Other (non HMO) | Admitting: Hospice and Palliative Medicine

## 2020-08-16 ENCOUNTER — Other Ambulatory Visit: Payer: Self-pay | Admitting: Hospice and Palliative Medicine

## 2020-08-25 ENCOUNTER — Other Ambulatory Visit: Payer: Managed Care, Other (non HMO)

## 2020-09-08 ENCOUNTER — Other Ambulatory Visit: Payer: Managed Care, Other (non HMO)

## 2020-09-23 ENCOUNTER — Ambulatory Visit: Payer: Managed Care, Other (non HMO) | Admitting: Nurse Practitioner

## 2020-09-24 ENCOUNTER — Ambulatory Visit: Payer: Managed Care, Other (non HMO) | Admitting: Hospice and Palliative Medicine

## 2020-11-17 ENCOUNTER — Other Ambulatory Visit: Payer: Self-pay

## 2020-11-17 ENCOUNTER — Other Ambulatory Visit: Payer: Self-pay | Admitting: Internal Medicine

## 2021-08-15 ENCOUNTER — Other Ambulatory Visit: Payer: Self-pay | Admitting: Internal Medicine

## 2024-01-25 ENCOUNTER — Telehealth (HOSPITAL_COMMUNITY): Payer: Self-pay | Admitting: *Deleted

## 2024-02-28 NOTE — Telephone Encounter (Signed)
 Opened in error

## 2024-02-28 NOTE — Telephone Encounter (Signed)
 Chart opened in error
